# Patient Record
Sex: Male | Born: 1975 | Race: Black or African American | Hispanic: No | Marital: Married | Smoking: Current every day smoker
Health system: Southern US, Community
[De-identification: ages and names within clinical notes are randomized; demographics above are authoritative.]

## PROBLEM LIST (undated history)

## (undated) DIAGNOSIS — J45909 Unspecified asthma, uncomplicated: Secondary | ICD-10-CM

## (undated) DIAGNOSIS — D649 Anemia, unspecified: Secondary | ICD-10-CM

## (undated) DIAGNOSIS — K649 Unspecified hemorrhoids: Secondary | ICD-10-CM

## (undated) HISTORY — PX: FINGER SURGERY: SHX640

## (undated) HISTORY — PX: APPENDECTOMY: SHX54

## (undated) HISTORY — PX: CLEFT PALATE REPAIR: SUR1165

---

## 1998-06-03 ENCOUNTER — Emergency Department (HOSPITAL_COMMUNITY): Admission: EM | Admit: 1998-06-03 | Discharge: 1998-06-03 | Payer: Self-pay | Admitting: Emergency Medicine

## 1999-02-18 ENCOUNTER — Emergency Department (HOSPITAL_COMMUNITY): Admission: EM | Admit: 1999-02-18 | Discharge: 1999-02-18 | Payer: Self-pay | Admitting: Emergency Medicine

## 2001-08-21 ENCOUNTER — Emergency Department (HOSPITAL_COMMUNITY): Admission: EM | Admit: 2001-08-21 | Discharge: 2001-08-21 | Payer: Self-pay | Admitting: Emergency Medicine

## 2001-09-02 ENCOUNTER — Encounter (INDEPENDENT_AMBULATORY_CARE_PROVIDER_SITE_OTHER): Payer: Self-pay | Admitting: *Deleted

## 2001-09-02 ENCOUNTER — Ambulatory Visit (HOSPITAL_COMMUNITY): Admission: RE | Admit: 2001-09-02 | Discharge: 2001-09-03 | Payer: Self-pay | Admitting: *Deleted

## 2004-06-07 ENCOUNTER — Emergency Department (HOSPITAL_COMMUNITY): Admission: EM | Admit: 2004-06-07 | Discharge: 2004-06-07 | Payer: Self-pay | Admitting: Emergency Medicine

## 2004-08-07 ENCOUNTER — Emergency Department (HOSPITAL_COMMUNITY): Admission: EM | Admit: 2004-08-07 | Discharge: 2004-08-07 | Payer: Self-pay | Admitting: Emergency Medicine

## 2005-01-17 ENCOUNTER — Emergency Department (HOSPITAL_COMMUNITY): Admission: EM | Admit: 2005-01-17 | Discharge: 2005-01-17 | Payer: Self-pay | Admitting: Emergency Medicine

## 2007-08-15 ENCOUNTER — Emergency Department (HOSPITAL_COMMUNITY): Admission: EM | Admit: 2007-08-15 | Discharge: 2007-08-15 | Payer: Self-pay | Admitting: Emergency Medicine

## 2009-03-21 ENCOUNTER — Emergency Department (HOSPITAL_BASED_OUTPATIENT_CLINIC_OR_DEPARTMENT_OTHER): Admission: EM | Admit: 2009-03-21 | Discharge: 2009-03-21 | Payer: Self-pay | Admitting: Emergency Medicine

## 2010-06-01 LAB — URINALYSIS, ROUTINE W REFLEX MICROSCOPIC
Bilirubin Urine: NEGATIVE
Glucose, UA: NEGATIVE mg/dL
Hgb urine dipstick: NEGATIVE
Ketones, ur: NEGATIVE mg/dL
Nitrite: NEGATIVE
Protein, ur: NEGATIVE mg/dL
Specific Gravity, Urine: 1.021 (ref 1.005–1.030)
Urobilinogen, UA: 0.2 mg/dL (ref 0.0–1.0)
pH: 5 (ref 5.0–8.0)

## 2010-06-01 LAB — DIFFERENTIAL
Basophils Absolute: 0.1 10*3/uL (ref 0.0–0.1)
Basophils Relative: 2 % — ABNORMAL HIGH (ref 0–1)
Eosinophils Absolute: 0.2 10*3/uL (ref 0.0–0.7)
Eosinophils Relative: 5 % (ref 0–5)
Lymphocytes Relative: 27 % (ref 12–46)
Lymphs Abs: 1.1 10*3/uL (ref 0.7–4.0)
Monocytes Absolute: 0.4 10*3/uL (ref 0.1–1.0)
Monocytes Relative: 10 % (ref 3–12)
Neutro Abs: 2.4 10*3/uL (ref 1.7–7.7)
Neutrophils Relative %: 56 % (ref 43–77)

## 2010-06-01 LAB — CBC
HCT: 36.8 % — ABNORMAL LOW (ref 39.0–52.0)
Hemoglobin: 12.7 g/dL — ABNORMAL LOW (ref 13.0–17.0)
MCHC: 34.4 g/dL (ref 30.0–36.0)
MCV: 95.8 fL (ref 78.0–100.0)
Platelets: 221 10*3/uL (ref 150–400)
RBC: 3.84 MIL/uL — ABNORMAL LOW (ref 4.22–5.81)
RDW: 12.7 % (ref 11.5–15.5)
WBC: 4.2 10*3/uL (ref 4.0–10.5)

## 2010-06-01 LAB — BASIC METABOLIC PANEL
BUN: 11 mg/dL (ref 6–23)
CO2: 24 mEq/L (ref 19–32)
Calcium: 9 mg/dL (ref 8.4–10.5)
Chloride: 107 mEq/L (ref 96–112)
Creatinine, Ser: 1.1 mg/dL (ref 0.4–1.5)
GFR calc Af Amer: >60 mL/min (ref >60)
GFR calc non Af Amer: >60 mL/min (ref >60)
Glucose, Bld: 122 mg/dL — ABNORMAL HIGH (ref 70–99)
Potassium: 4.2 mEq/L (ref 3.5–5.1)
Sodium: 142 mEq/L (ref 135–145)

## 2010-06-01 LAB — HEMOCCULT GUIAC POC 1CARD (OFFICE): Fecal Occult Bld: POSITIVE

## 2010-08-01 NOTE — Op Note (Signed)
[place]  Patient:    Matthew Oneal, Matthew Oneal Visit Number: [id number] MRN: [id number]          Service Type: DSU Location: [phone] Attending Physician:  Kandis Mannan Dictated by:   Donnie Coffin Samuella Cota, M.D. Proc. Date: 09/02/01 Admit Date:  09/02/2001 Discharge Date: 09/03/2001                             Operative Report  CCS #[id number].  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  PROCEDURE:  Exploration of old appendectomy wound with removal of old sutures.  SURGEON:  Maisie Fus B. Samuella Cota, M.D.  ANESTHESIA:  General, anesthesiologist and C.R.N.A.  DESCRIPTION OF PROCEDURE:  The patient was taken to the operating room and placed on the table in supine position.  After satisfactory general anesthetic, the right lower quadrant of the abdomen was prepped and draped in a sterile field.  The patient had a previous appendectomy in 1994.  Since that time he has had an exploration of the wound in 1995 with removal of two Prolene sutures.  For about a year now he has had some intermittent drainage from the incision.  The patient desired to have the wound explored to see if we could get rid of the drainage.  The old transverse scar was somewhat wide and prominent.  An elliptical incision was made to remove all of the old scar. This was taken through skin and subcutaneous tissue at about the lateral one-fourth of the incision.  The patient had a small opening where he has been having some drainage.  There seemed to be some firmness going cephalad.  The subcutaneous tissue was dissected free and surrounded this one area.  There was a rather large monofilament suture with perhaps eight to 10 knots in it, which was removed.  The fascia was opened transversely and another suture was found.  Dissection was taken down deep to see if there was a tract.   The dissection did get down to the peritoneal cavity.  The patient had some small bowel adherent just beneath the scar.  Just enough of this was dissected free to be sure there was no drainage into the abdomen.  There was no evidence of any abscess in the abdomen.  The fascia and peritoneum deeply was then closed with interrupted figure-of-eight sutures of 0 Vicryl.  The wound was copiously irrigated during each layer of the closure.  A two-layered closure after that was carried out using figure-of-eight sutures of 0 Vicryl.  Another suture was found distally and another was found proximally.  At all of these sutures there was no evidence of an abscess.  I never did see any purulent drainage. The wound was copiously irrigated, subcutaneous tissue approximated loosely with 3-0 Vicryl, and skin was closed with skin staples.  A dry sterile dressing was applied.  The patient seemed to tolerate the procedure well and was taken to the PACU in satisfactory condition. Dictated by:   Donnie Coffin Samuella Cota, M.D. Attending Physician:  Kandis Mannan DD:  09/02/01 TD:  09/04/01 Job: [id number]

## 2010-12-11 LAB — URINALYSIS, ROUTINE W REFLEX MICROSCOPIC
Bilirubin Urine: NEGATIVE
Glucose, UA: NEGATIVE
Hgb urine dipstick: NEGATIVE
Ketones, ur: NEGATIVE
Nitrite: NEGATIVE
Protein, ur: NEGATIVE
Specific Gravity, Urine: 1.015
Urobilinogen, UA: 0.2
pH: 5.5

## 2010-12-11 LAB — COMPREHENSIVE METABOLIC PANEL
ALT: 16
AST: 22
Albumin: 4.2
Alkaline Phosphatase: 42
BUN: 14
CO2: 23
Calcium: 9.5
Chloride: 106
Creatinine, Ser: 1.19
GFR calc Af Amer: >60
GFR calc non Af Amer: >60
Glucose, Bld: 91
Potassium: 4
Sodium: 135
Total Bilirubin: 0.9
Total Protein: 7.2

## 2010-12-11 LAB — GIARDIA/CRYPTOSPORIDIUM SCREEN(EIA)
Cryptosporidium Screen (EIA): NEGATIVE
Giardia Screen - EIA: NEGATIVE

## 2011-09-28 ENCOUNTER — Encounter (HOSPITAL_BASED_OUTPATIENT_CLINIC_OR_DEPARTMENT_OTHER): Payer: Self-pay | Admitting: *Deleted

## 2011-09-28 ENCOUNTER — Emergency Department (HOSPITAL_BASED_OUTPATIENT_CLINIC_OR_DEPARTMENT_OTHER)
Admission: EM | Admit: 2011-09-28 | Discharge: 2011-09-28 | Disposition: A | Discharge status: Home / Self Care | Payer: Self-pay | Attending: Emergency Medicine | Admitting: Emergency Medicine

## 2011-09-28 DIAGNOSIS — T63441A Toxic effect of venom of bees, accidental (unintentional), initial encounter: Secondary | ICD-10-CM

## 2011-09-28 DIAGNOSIS — T63461A Toxic effect of venom of wasps, accidental (unintentional), initial encounter: Secondary | ICD-10-CM | POA: Insufficient documentation

## 2011-09-28 DIAGNOSIS — T6391XA Toxic effect of contact with unspecified venomous animal, accidental (unintentional), initial encounter: Secondary | ICD-10-CM | POA: Insufficient documentation

## 2011-09-28 DIAGNOSIS — F17200 Nicotine dependence, unspecified, uncomplicated: Secondary | ICD-10-CM | POA: Insufficient documentation

## 2011-09-28 HISTORY — DX: Unspecified asthma, uncomplicated: J45.909

## 2011-09-28 MED ORDER — PREDNISONE 20 MG PO TABS: 40.0000 mg | ORAL_TABLET | Freq: Every day | ORAL | Status: AC

## 2011-09-28 MED ORDER — EPINEPHRINE 0.3 MG/0.3ML IJ DEVI
0.3000 mg | INTRAMUSCULAR | Status: DC | PRN
Start: 2011-09-28 — End: 2014-11-03

## 2011-09-28 MED ORDER — PREDNISONE 50 MG PO TABS
60.0000 mg | ORAL_TABLET | Freq: Once | ORAL | Status: AC
  Administered 2011-09-28: 60 mg via ORAL
  Filled 2011-09-28: qty 1

## 2011-09-28 NOTE — Discharge Instructions (Signed)
Bee, Wasp, or Hornet Sting   Your caregiver has diagnosed you as having an insect sting. An insect sting appears as a red lump in the skin that sometimes has a tiny hole in the center, or it may have a stinger in the center of the wound. The most common stings are from wasps, hornets and bees.   Individuals have different reactions to insect stings.   A normal reaction may cause pain, swelling, and redness around the sting site.   A localized allergic reaction may cause swelling and redness that extends beyond the sting site.   A large local reaction may continue to develop over the next 12 to 36 hours.   On occasion, the reactions can be severe (anaphylactic reaction). An anaphylactic reaction may cause wheezing; difficulty breathing; chest pain; fainting; raised, itchy, red patches on the skin; a sick feeling to your stomach (nausea); vomiting; cramping; or diarrhea. If you have had an anaphylactic reaction to an insect sting in the past, you are more likely to have one again.   HOME CARE INSTRUCTIONS   With bee stings, a small sac of poison is left in the wound. Brushing across this with something such as a credit card, or anything similar, will help remove this and decrease the amount of the reaction. This same procedure will not help a wasp sting as they do not leave behind a stinger and poison sac.   Apply a cold compress for 10 to 20 minutes every hour for 1 to 2 days, depending on severity, to reduce swelling and itching.   To lessen pain, a paste made of water and baking soda may be rubbed on the bite or sting and left on for 5 minutes.   To relieve itching and swelling, you may use take medication or apply medicated creams or lotions as directed.   Only take over-the-counter or prescription medicines for pain, discomfort, or fever as directed by your caregiver.   Wash the sting site daily with soap and water. Apply antibiotic ointment on the sting site as directed.   If you suffered a severe reaction:   If  you did not require hospitalization, an adult will need to stay with you for 24 hours in case the symptoms return.   You may need to wear a medical bracelet or necklace stating the allergy.   You and your family need to learn when and how to use an anaphylaxis kit or epinephrine injection.   If you have had a severe reaction before, always carry your anaphylaxis kit with you.   SEEK MEDICAL CARE IF:   None of the above helps within 2 to 3 days.   The area becomes red, warm, tender, and swollen beyond the area of the bite or sting.   You have an oral temperature above 102° F (38.9° C).   SEEK IMMEDIATE MEDICAL CARE IF:   You have symptoms of an allergic reaction which are:   Wheezing.   Difficulty breathing.   Chest pain.   Lightheadedness or fainting.   Itchy, raised, red patches on the skin.   Nausea, vomiting, cramping or diarrhea.   ANY OF THESE SYMPTOMS MAY REPRESENT A SERIOUS PROBLEM THAT IS AN EMERGENCY. Do not wait to see if the symptoms will go away. Get medical help right away. Call your local emergency services (911 in [place] DO NOT drive yourself to the hospital.   MAKE SURE YOU:   Understand these instructions.   Will watch your condition.     Will get help right away if you are not doing well or get worse.   Document Released: 03/02/2005 Document Revised: 02/19/2011 Document Reviewed: 08/17/2009   Exit[employer]® Patient Information ©2012 [employer].

## 2011-09-28 NOTE — ED Provider Notes (Signed)
History  This chart was scribed for Gavin Pound. Oletta Lamas, MD by Bennett Scrape. This patient was seen in room MH10/MH10 and the patient's care was started at 7:34PM.  CSN: [id number]  Arrival date & time 09/28/11  1900   First MD Initiated Contact with Patient 09/28/11 1934      Chief Complaint  Patient presents with  . Allergic Reaction  . Insect Bite     The history is provided by the patient. No language interpreter was used.    Matthew Oneal is a 36 y.o. male who presents to the Emergency Department complaining of 24 hours of gradual onset, gradually worsening, constant left forearm swelling that extends into the wrist that started after being stung by a yellow jacket. He has been taking benadryl with mild improvement in the symptoms. He reports prior episodes of bee stings with similar swelling symptoms. He denies trouble swallowing, trouble breathing, chest tightness, and wheezing as associated symptoms. He has a h/o asthma. He is a current everyday smoker. Has been taking benadryl with no sig relief.   Pt is right handed.  Past Medical History  Diagnosis Date  . Asthma     Past Surgical History  Procedure Date  . Appendectomy   . Finger surgery     History reviewed. No pertinent family history.  History  Substance Use Topics  . Smoking status: Current Everyday Smoker  . Smokeless tobacco: Never Used  . Alcohol Use: Not on file      Review of Systems  Constitutional: Negative for fever and chills.  HENT: Negative for sore throat, facial swelling and trouble swallowing.   Respiratory: Negative for chest tightness, shortness of breath and wheezing.   Cardiovascular: Negative for chest pain.  Gastrointestinal: Negative for nausea and vomiting.  Musculoskeletal: Positive for joint swelling and arthralgias. Negative for back pain.       Left forearm swelling  Skin: Positive for wound (insect bite).  Neurological: Negative for dizziness, light-headedness and  headaches.    Allergies  Bee venom  Home Medications   Current Outpatient Rx  Name Route Sig Dispense Refill  . BENADRYL ALLERGY PO Oral Take 2 tablets by mouth daily as needed. Patient took this medication for his bee sting.    Marland Kitchen EPINEPHRINE 0.3 MG/0.3ML IJ DEVI Intramuscular Inject 0.3 mLs (0.3 mg total) into the muscle as needed. 1 Device 0  . PREDNISONE 20 MG PO TABS Oral Take 2 tablets (40 mg total) by mouth daily. 12 tablet 0    Triage vitals: BP 110/71  Pulse 95  Temp 98.8 F (37.1 C) (Oral)  Resp 12  SpO2 98%  Physical Exam  Nursing note and vitals reviewed. Constitutional: He is oriented to person, place, and time. He appears well-developed and well-nourished. No distress.  HENT:  Head: Normocephalic and atraumatic.  Eyes: EOM are normal.  Neck: Neck supple. No tracheal deviation present.  Cardiovascular: Normal rate.   Pulmonary/Chest: Effort normal. No stridor. No respiratory distress.  Musculoskeletal: Normal range of motion. He exhibits edema (left forearm swelling). He exhibits no tenderness.       Left forearm is diffusely swollen, not warm to touch, no rash, no induration or fluctuance noted. Decreased ROM of wrist and elbow due to pain, but able to range through entire motion.  Neurological: He is alert and oriented to person, place, and time.  Skin: Skin is warm and dry. No rash noted. There is erythema. No pallor.  Psychiatric: He has a normal mood and affect.  His behavior is normal.    ED Course  Procedures (including critical care time)  DIAGNOSTIC STUDIES: Oxygen Saturation is 98% on room air, normal by my interpretation.    COORDINATION OF CARE: 7:47PM-Discussed treatment plan which includes prednisone with pt at bedside and pt agreed to plan. Advised pt to continue the benadryl and to follow up with PCP on getting an epi pen.   Labs Reviewed - No data to display No results found.   1. Allergic reaction to bee sting       MDM  I  personally performed the services described in this documentation, which was scribed in my presence. The recorded information has been reviewed and considered.       Gavin Pound. Oletta Lamas, MD 09/28/11 0454

## 2011-09-28 NOTE — ED Notes (Signed)
Pt reports was stung on left arm by yellow jacket yeaterday- left wrist and forearm swollen- has been taking benadryl

## 2011-10-19 ENCOUNTER — Encounter (HOSPITAL_BASED_OUTPATIENT_CLINIC_OR_DEPARTMENT_OTHER): Payer: Self-pay | Admitting: *Deleted

## 2011-10-19 ENCOUNTER — Emergency Department (HOSPITAL_BASED_OUTPATIENT_CLINIC_OR_DEPARTMENT_OTHER)
Admission: EM | Admit: 2011-10-19 | Discharge: 2011-10-19 | Disposition: A | Discharge status: Home / Self Care | Payer: Self-pay | Attending: Emergency Medicine | Admitting: Emergency Medicine

## 2011-10-19 DIAGNOSIS — T6391XA Toxic effect of contact with unspecified venomous animal, accidental (unintentional), initial encounter: Secondary | ICD-10-CM | POA: Insufficient documentation

## 2011-10-19 DIAGNOSIS — F17200 Nicotine dependence, unspecified, uncomplicated: Secondary | ICD-10-CM | POA: Insufficient documentation

## 2011-10-19 DIAGNOSIS — T63461A Toxic effect of venom of wasps, accidental (unintentional), initial encounter: Secondary | ICD-10-CM | POA: Insufficient documentation

## 2011-10-19 DIAGNOSIS — Z91018 Allergy to other foods: Secondary | ICD-10-CM | POA: Insufficient documentation

## 2011-10-19 DIAGNOSIS — Z91013 Allergy to seafood: Secondary | ICD-10-CM | POA: Insufficient documentation

## 2011-10-19 MED ORDER — IBUPROFEN 800 MG PO TABS
800.0000 mg | ORAL_TABLET | Freq: Once | ORAL | Status: AC
  Administered 2011-10-19: 800 mg via ORAL
  Filled 2011-10-19 (×2): qty 1

## 2011-10-19 NOTE — ED Provider Notes (Signed)
Medical screening examination/treatment/procedure(s) were performed by non-physician practitioner and as supervising physician I was immediately available for consultation/collaboration.  Ethelda Chick, MD 10/19/11 [phone]

## 2011-10-19 NOTE — ED Notes (Signed)
Bee sting to his left arm yesterday. Here today for pain and swelling in his arm today. Has been taking Claritin and Benadryl.

## 2011-10-19 NOTE — ED Notes (Signed)
Patient states was stung by a black wasp on his arm yesterday. Patient has taken benedryl today but has had no breathing difficulty since sting.

## 2011-10-19 NOTE — ED Notes (Signed)
Patient was stung by bee yesterday early afternoon on left forearm and reports continued itching to site. Mild swelling and redness noted, no distress. Reports taking otc benadryl since the sting

## 2011-10-19 NOTE — ED Provider Notes (Signed)
History     CSN: [id number]  Arrival date & time 10/19/11  1814   First MD Initiated Contact with Patient 10/19/11 1932      Chief Complaint  Patient presents with  . Allergic Reaction    (Consider location/radiation/quality/duration/timing/severity/associated sxs/prior treatment) Patient is a 36 y.o. male presenting with allergic reaction. The history is provided by the patient and the spouse.  Allergic Reaction The primary symptoms do not include shortness of breath or rash.  Associated symptoms comments: He was stung by a wasp earlier today and presents with pain and swelling localized to the site. No SOB, swelling, or difficulty swallowing. He was told he had an allergic reaction in the past to bee stings and became concerned when the area began to swell..    Past Medical History  Diagnosis Date  . Asthma     Past Surgical History  Procedure Date  . Appendectomy   . Finger surgery     No family history on file.  History  Substance Use Topics  . Smoking status: Current Everyday Smoker  . Smokeless tobacco: Never Used  . Alcohol Use: 8.4 oz/week    14 Cans of beer per week      Review of Systems  Constitutional: Negative for fever.  HENT: Negative for facial swelling, trouble swallowing and voice change.   Respiratory: Negative for shortness of breath.   Skin: Negative for rash.       See HPI.    Allergies  Shellfish allergy and Bee venom  Home Medications   Current Outpatient Rx  Name Route Sig Dispense Refill  . DIPHENHYDRAMINE HCL 25 MG PO TABS Oral Take 75 mg by mouth once as needed. For bee sting reaction    . IBUPROFEN 200 MG PO TABS Oral Take 400 mg by mouth every 6 (six) hours as needed. For pain    . LORATADINE 10 MG PO TABS Oral Take 10 mg by mouth daily.    Marland Kitchen EPINEPHRINE 0.3 MG/0.3ML IJ DEVI Intramuscular Inject 0.3 mLs (0.3 mg total) into the muscle as needed. 1 Device 0    BP 126/70  Pulse 92  Temp 98.9 F (37.2 C) (Oral)  Resp 20   SpO2 100%  Physical Exam  Constitutional: He is oriented to person, place, and time. He appears well-developed and well-nourished. No distress.  HENT:  Mouth/Throat: Oropharynx is clear and moist.  Cardiovascular: Normal rate and regular rhythm.   Pulmonary/Chest: Effort normal and breath sounds normal. He has no wheezes.  Musculoskeletal:       Left forearm swollen proximally with central small area of redness No blistering. No induration to indicate abscess. Swelling extends outward but remains in forearm without greater degree of spread. Distal forearm has no swelling or redness.   Neurological: He is alert and oriented to person, place, and time.  Skin: No rash noted.    ED Course  Procedures (including critical care time)  Labs Reviewed - No data to display No results found.   No diagnosis found.  1. Wasp sting  MDM  Exam supports localized inflammatory response only. No evidence of systemic allergic reaction.        Rodena Medin, PA-C 10/19/11 1948

## 2012-07-06 ENCOUNTER — Emergency Department (HOSPITAL_BASED_OUTPATIENT_CLINIC_OR_DEPARTMENT_OTHER)
Admission: EM | Admit: 2012-07-06 | Discharge: 2012-07-07 | Disposition: A | Discharge status: Home / Self Care | Payer: Self-pay | Attending: Emergency Medicine | Admitting: Emergency Medicine

## 2012-07-06 ENCOUNTER — Encounter (HOSPITAL_BASED_OUTPATIENT_CLINIC_OR_DEPARTMENT_OTHER): Payer: Self-pay | Admitting: *Deleted

## 2012-07-06 DIAGNOSIS — J45909 Unspecified asthma, uncomplicated: Secondary | ICD-10-CM | POA: Insufficient documentation

## 2012-07-06 DIAGNOSIS — F17200 Nicotine dependence, unspecified, uncomplicated: Secondary | ICD-10-CM | POA: Insufficient documentation

## 2012-07-06 DIAGNOSIS — R5381 Other malaise: Secondary | ICD-10-CM | POA: Insufficient documentation

## 2012-07-06 DIAGNOSIS — R590 Localized enlarged lymph nodes: Secondary | ICD-10-CM

## 2012-07-06 DIAGNOSIS — R599 Enlarged lymph nodes, unspecified: Secondary | ICD-10-CM | POA: Insufficient documentation

## 2012-07-06 DIAGNOSIS — R51 Headache: Secondary | ICD-10-CM | POA: Insufficient documentation

## 2012-07-06 DIAGNOSIS — Z79899 Other long term (current) drug therapy: Secondary | ICD-10-CM | POA: Insufficient documentation

## 2012-07-06 LAB — CBC WITH DIFFERENTIAL/PLATELET
Basophils Absolute: 0 10*3/uL (ref 0.0–0.1)
Basophils Relative: 0 % (ref 0–1)
Eosinophils Absolute: 0.3 10*3/uL (ref 0.0–0.7)
Eosinophils Relative: 5 % (ref 0–5)
HCT: 30.8 % — ABNORMAL LOW (ref 39.0–52.0)
Hemoglobin: 10.2 g/dL — ABNORMAL LOW (ref 13.0–17.0)
Lymphocytes Relative: 34 % (ref 12–46)
Lymphs Abs: 1.9 10*3/uL (ref 0.7–4.0)
MCH: 28.8 pg (ref 26.0–34.0)
MCHC: 33.1 g/dL (ref 30.0–36.0)
MCV: 87 fL (ref 78.0–100.0)
Monocytes Absolute: 0.7 10*3/uL (ref 0.1–1.0)
Monocytes Relative: 12 % (ref 3–12)
Neutro Abs: 2.7 10*3/uL (ref 1.7–7.7)
Neutrophils Relative %: 48 % (ref 43–77)
Platelets: 256 10*3/uL (ref 150–400)
RBC: 3.54 MIL/uL — ABNORMAL LOW (ref 4.22–5.81)
RDW: 13.6 % (ref 11.5–15.5)
WBC: 5.6 10*3/uL (ref 4.0–10.5)

## 2012-07-06 NOTE — ED Provider Notes (Signed)
History     CSN: [id number]  Arrival date & time 07/06/12  1925   First MD Initiated Contact with Patient 07/06/12 2332      Chief Complaint  Patient presents with  . Facial Swelling    (Consider location/radiation/quality/duration/timing/severity/associated sxs/prior treatment) HPI This is a 37 year old male with 2-3 day history of swollen, tender anterior cervical lymph nodes. The symptoms are mild to moderate. There's been occasional mild headache associated with it along with some general malaise he denies fever or sore throat. He denies abdominal pain.  Past Medical History  Diagnosis Date  . Asthma     Past Surgical History  Procedure Laterality Date  . Appendectomy    . Finger surgery      History reviewed. No pertinent family history.  History  Substance Use Topics  . Smoking status: Current Every Day Smoker    Types: Cigarettes  . Smokeless tobacco: Never Used  . Alcohol Use: 8.4 oz/week    14 Cans of beer per week      Review of Systems  All other systems reviewed and are negative.    Allergies  Shellfish allergy and Bee venom  Home Medications   Current Outpatient Rx  Name  Route  Sig  Dispense  Refill  . diphenhydrAMINE (BENADRYL) 25 MG tablet   Oral   Take 75 mg by mouth once as needed. For bee sting reaction         . EPINEPHrine (EPIPEN) 0.3 mg/0.3 mL DEVI   Intramuscular   Inject 0.3 mLs (0.3 mg total) into the muscle as needed.   1 Device   0   . ibuprofen (ADVIL,MOTRIN) 200 MG tablet   Oral   Take 400 mg by mouth every 6 (six) hours as needed. For pain         . loratadine (CLARITIN) 10 MG tablet   Oral   Take 10 mg by mouth daily.           BP 136/74  Pulse 99  Temp(Src) 98.1 F (36.7 C) (Oral)  Resp 16  Ht 5\' 10"  (1.778 m)  Wt 183 lb (83.008 kg)  BMI 26.26 kg/m2  SpO2 96%  Physical Exam General: Well-developed, well-nourished male in no acute distress; appearance consistent with age of record HENT:  normocephalic, atraumatic; surgical changes consistent with cleft palate repair; no pharyngeal erythema or exudate Eyes: pupils equal round and reactive to light; extraocular muscles intact Neck: supple; mild anterior cervical lymphadenopathy Heart: regular rate and rhythm Lungs: clear to auscultation bilaterally Abdomen: soft; nondistended; nontender; no masses or hepatosplenomegaly; bowel sounds present Extremities: No deformity; full range of motion Neurologic: Awake, alert and oriented; motor function intact in all extremities and symmetric; no facial droop Skin: Warm and dry Psychiatric: Normal mood and affect    ED Course  Procedures (including critical care time)     MDM   Nursing notes and vitals signs, including pulse oximetry, reviewed.  Summary of this visit's results, reviewed by myself:  Labs:  Results for orders placed during the hospital encounter of 07/06/12 (from the past 24 hour(s))  CBC WITH DIFFERENTIAL     Status: Abnormal   Collection Time    07/06/12 11:37 PM      Result Value Range   WBC 5.6  4.0 - 10.5 K/uL   RBC 3.54 (*) 4.22 - 5.81 MIL/uL   Hemoglobin 10.2 (*) 13.0 - 17.0 g/dL   HCT 40.9 (*) 81.1 - 91.4 %   MCV 87.0  78.0 - 100.0 fL   MCH 28.8  26.0 - 34.0 pg   MCHC 33.1  30.0 - 36.0 g/dL   RDW 16.1  09.6 - 04.5 %   Platelets 256  150 - 400 K/uL   Neutrophils Relative 48  43 - 77 %   Neutro Abs 2.7  1.7 - 7.7 K/uL   Lymphocytes Relative 34  12 - 46 %   Lymphs Abs 1.9  0.7 - 4.0 K/uL   Monocytes Relative 12  3 - 12 %   Monocytes Absolute 0.7  0.1 - 1.0 K/uL   Eosinophils Relative 5  0 - 5 %   Eosinophils Absolute 0.3  0.0 - 0.7 K/uL   Basophils Relative 0  0 - 1 %   Basophils Absolute 0.0  0.0 - 0.1 K/uL  MONONUCLEOSIS SCREEN     Status: None   Collection Time    07/06/12 11:37 PM      Result Value Range   Mono Screen NEGATIVE  NEGATIVE           Carlisle Beers Skye Plamondon, MD 07/07/12 [phone]

## 2012-07-06 NOTE — ED Notes (Signed)
Pt c/o " swelling lymph neck glands" denies sore throat

## 2012-07-06 NOTE — ED Notes (Signed)
Pt c/o swollen lymph notes.  States has had sinus congestion and mild cough for past 1-2 weeks, and noticed the swelling 3-4 days ago.

## 2012-07-07 ENCOUNTER — Encounter (HOSPITAL_BASED_OUTPATIENT_CLINIC_OR_DEPARTMENT_OTHER): Payer: Self-pay | Admitting: Emergency Medicine

## 2012-07-07 LAB — MONONUCLEOSIS SCREEN: Mono Screen: NEGATIVE

## 2013-01-09 ENCOUNTER — Encounter (HOSPITAL_BASED_OUTPATIENT_CLINIC_OR_DEPARTMENT_OTHER): Payer: Self-pay | Admitting: Emergency Medicine

## 2013-01-09 ENCOUNTER — Emergency Department (HOSPITAL_BASED_OUTPATIENT_CLINIC_OR_DEPARTMENT_OTHER): Payer: Self-pay

## 2013-01-09 ENCOUNTER — Emergency Department (HOSPITAL_BASED_OUTPATIENT_CLINIC_OR_DEPARTMENT_OTHER)
Admission: EM | Admit: 2013-01-09 | Discharge: 2013-01-09 | Disposition: A | Discharge status: Home / Self Care | Payer: Self-pay | Attending: Emergency Medicine | Admitting: Emergency Medicine

## 2013-01-09 DIAGNOSIS — J45909 Unspecified asthma, uncomplicated: Secondary | ICD-10-CM | POA: Insufficient documentation

## 2013-01-09 DIAGNOSIS — Z79899 Other long term (current) drug therapy: Secondary | ICD-10-CM | POA: Insufficient documentation

## 2013-01-09 DIAGNOSIS — K8689 Other specified diseases of pancreas: Secondary | ICD-10-CM | POA: Insufficient documentation

## 2013-01-09 DIAGNOSIS — F17200 Nicotine dependence, unspecified, uncomplicated: Secondary | ICD-10-CM | POA: Insufficient documentation

## 2013-01-09 LAB — COMPREHENSIVE METABOLIC PANEL
Albumin: 3.8 g/dL (ref 3.5–5.2)
Alkaline Phosphatase: 53 U/L (ref 39–117)
BUN: 11 mg/dL (ref 6–23)
CO2: 24 mEq/L (ref 19–32)
Calcium: 9.5 mg/dL (ref 8.4–10.5)
Creatinine, Ser: 1.3 mg/dL (ref 0.50–1.35)
GFR calc Af Amer: 79 mL/min — ABNORMAL LOW (ref >90)
GFR calc non Af Amer: 68 mL/min — ABNORMAL LOW (ref >90)
Glucose, Bld: 99 mg/dL (ref 70–99)
Potassium: 4.3 mEq/L (ref 3.5–5.1)
Sodium: 139 mEq/L (ref 135–145)
Total Bilirubin: 0.4 mg/dL (ref 0.3–1.2)
Total Protein: 7.6 g/dL (ref 6.0–8.3)

## 2013-01-09 LAB — URINALYSIS, ROUTINE W REFLEX MICROSCOPIC
Bilirubin Urine: NEGATIVE
Hgb urine dipstick: NEGATIVE
Ketones, ur: NEGATIVE mg/dL
Nitrite: NEGATIVE
Urobilinogen, UA: 0.2 mg/dL (ref 0.0–1.0)
pH: 5 (ref 5.0–8.0)

## 2013-01-09 LAB — CBC WITH DIFFERENTIAL/PLATELET
Basophils Absolute: 0 10*3/uL (ref 0.0–0.1)
Basophils Relative: 0 % (ref 0–1)
Eosinophils Absolute: 0.2 10*3/uL (ref 0.0–0.7)
Eosinophils Relative: 4 % (ref 0–5)
HCT: 27.7 % — ABNORMAL LOW (ref 39.0–52.0)
Hemoglobin: 8.5 g/dL — ABNORMAL LOW (ref 13.0–17.0)
MCH: 23 pg — ABNORMAL LOW (ref 26.0–34.0)
MCHC: 30.7 g/dL (ref 30.0–36.0)
MCV: 75.1 fL — ABNORMAL LOW (ref 78.0–100.0)
Monocytes Absolute: 0.7 10*3/uL (ref 0.1–1.0)
Monocytes Relative: 14 % — ABNORMAL HIGH (ref 3–12)
Neutrophils Relative %: 53 % (ref 43–77)
Platelets: 303 10*3/uL (ref 150–400)
RBC: 3.69 MIL/uL — ABNORMAL LOW (ref 4.22–5.81)

## 2013-01-09 LAB — LIPASE, BLOOD: Lipase: 12 U/L (ref 11–59)

## 2013-01-09 MED ORDER — ONDANSETRON HCL 4 MG/2ML IJ SOLN
4.0000 mg | Freq: Once | INTRAMUSCULAR | Status: AC
  Administered 2013-01-09: 4 mg via INTRAMUSCULAR
  Filled 2013-01-09: qty 2

## 2013-01-09 MED ORDER — IOHEXOL 300 MG/ML  SOLN
50.0000 mL | Freq: Once | INTRAMUSCULAR | Status: AC | PRN
  Administered 2013-01-09: 50 mL via ORAL

## 2013-01-09 MED ORDER — HYDROCODONE-ACETAMINOPHEN 5-325 MG PO TABS: 2.0000 | ORAL_TABLET | ORAL | Status: DC | PRN

## 2013-01-09 MED ORDER — FAMOTIDINE IN NACL 20-0.9 MG/50ML-% IV SOLN
20.0000 mg | Freq: Once | INTRAVENOUS | Status: AC
  Administered 2013-01-09: 20 mg via INTRAVENOUS
  Filled 2013-01-09: qty 50

## 2013-01-09 MED ORDER — IOHEXOL 300 MG/ML  SOLN
100.0000 mL | Freq: Once | INTRAMUSCULAR | Status: AC | PRN
  Administered 2013-01-09: 100 mL via INTRAVENOUS

## 2013-01-09 MED ORDER — LANSOPRAZOLE 30 MG PO CPDR: 30.0000 mg | DELAYED_RELEASE_CAPSULE | Freq: Every day | ORAL | Status: DC

## 2013-01-09 NOTE — ED Provider Notes (Signed)
CSN: [id number]     Arrival date & time 01/09/13  1123 History   First MD Initiated Contact with Patient 01/09/13 1313     Chief Complaint  Patient presents with  . Abdominal Pain   (Consider location/radiation/quality/duration/timing/severity/associated sxs/prior Treatment) Patient is a 37 y.o. male presenting with abdominal pain. The history is provided by the patient. No language interpreter was used.  Abdominal Pain Pain location:  Epigastric Pain quality: aching and fullness   Pain radiates to:  Does not radiate Pain severity:  No pain Timing:  Constant Progression:  Worsening Chronicity:  New Worsened by:  Nothing tried Ineffective treatments:  None tried Associated symptoms: no vomiting     Past Medical History  Diagnosis Date  . Asthma    Past Surgical History  Procedure Laterality Date  . Appendectomy    . Finger surgery    . Cleft palate repair     No family history on file. History  Substance Use Topics  . Smoking status: Current Every Day Smoker    Types: Cigarettes  . Smokeless tobacco: Never Used  . Alcohol Use: 8.4 oz/week    14 Cans of beer per week     Comment: occassionally    Review of Systems  Gastrointestinal: Positive for abdominal pain. Negative for vomiting.  All other systems reviewed and are negative.    Allergies  Shellfish allergy and Bee venom  Home Medications   Current Outpatient Rx  Name  Route  Sig  Dispense  Refill  . diphenhydrAMINE (BENADRYL) 25 MG tablet   Oral   Take 75 mg by mouth once as needed. For bee sting reaction         . EPINEPHrine (EPIPEN) 0.3 mg/0.3 mL DEVI   Intramuscular   Inject 0.3 mLs (0.3 mg total) into the muscle as needed.   1 Device   0   . ibuprofen (ADVIL,MOTRIN) 200 MG tablet   Oral   Take 400 mg by mouth every 6 (six) hours as needed. For pain         . loratadine (CLARITIN) 10 MG tablet   Oral   Take 10 mg by mouth daily.          BP 122/80  Pulse 71  Temp(Src) 97.8 F  (36.6 C) (Oral)  Resp 16  Ht 5\' 10"  (1.778 m)  Wt 182 lb (82.555 kg)  BMI 26.11 kg/m2  SpO2 100% Physical Exam  Nursing note and vitals reviewed. Constitutional: He is oriented to person, place, and time. He appears well-developed and well-nourished.  HENT:  Head: Normocephalic and atraumatic.  Eyes: Conjunctivae and EOM are normal. Pupils are equal, round, and reactive to light.  Neck: Normal range of motion.  Cardiovascular: Normal rate.   Pulmonary/Chest: Effort normal and breath sounds normal.  Abdominal: Soft. Bowel sounds are normal. There is tenderness.  Musculoskeletal: Normal range of motion.  Neurological: He is alert and oriented to person, place, and time. He has normal reflexes.  Skin: Skin is warm.  Psychiatric: He has a normal mood and affect.    ED Course  Procedures (including critical care time) Labs Review Labs Reviewed  CBC WITH DIFFERENTIAL - Abnormal; Notable for the following:    RBC 3.69 (*)    Hemoglobin 8.5 (*)    HCT 27.7 (*)    MCV 75.1 (*)    MCH 23.0 (*)    RDW 18.0 (*)    Monocytes Relative 14 (*)    All other components within  normal limits  COMPREHENSIVE METABOLIC PANEL - Abnormal; Notable for the following:    GFR calc non Af Amer 68 (*)    GFR calc Af Amer 79 (*)    All other components within normal limits  URINALYSIS, ROUTINE W REFLEX MICROSCOPIC  LIPASE, BLOOD   Imaging Review Ct Abdomen Pelvis W Contrast  01/09/2013   CLINICAL DATA:  The midline abdominal pain  EXAM: CT ABDOMEN AND PELVIS WITH CONTRAST  TECHNIQUE: Multidetector CT imaging of the abdomen and pelvis was performed using the standard protocol following bolus administration of intravenous contrast. Oral contrast was also administered.  CONTRAST:  OMNIPAQUE IOHEXOL 300 MG/ML  SOLN  COMPARISON:  None.  FINDINGS: The lung bases are clear.  There is a 1 cm apparent cyst in the dome of the liver toward the right. No other focal liver lesion identified. There is no  appreciable biliary duct dilatation.  Spleen appears normal.  There is soft tissue fullness in the head of the pancreas measuring 4.6 x 3.7 cm. The remainder the pancreas appears normal. There is no appreciable pancreatic duct dilatation. This fullness is of the same attenuation as the remainder the pancreas. A small amount of fluid surrounds this soft tissue fullness in the head of pancreas region.  The adrenals and kidneys appear within normal limits.  In the pelvis, there is no mass or fluid collection. Appendix is absent.  There is no bowel obstruction. No free air or portal venous air.  There is no ascites, adenopathy, or abscess in the abdomen or pelvis. Aorta is nonaneurysmal. There are no blastic or lytic bone lesions.  IMPRESSION: There is soft tissue prominence in the head of the pancreas the which is of the same attenuation as the remainder the pancreas. There is a small amount of surrounding fluid. There is no evidence of abscess or pseudocyst. There is no biliary or pancreatic duct dilatation. Suspect a degree of acute pancreatitis. As a pancreatic mass could present in this manner, nonemergent MR of the pancreas with and without contrast advised to further assess.  Small liver cyst.  No bowel obstruction. No abscess. Appendix absent.   Electronically Signed   By: Bretta Bang M.D.   On: 01/09/2013 14:38    EKG Interpretation   None       MDM   1. Mass of pancreas    Pt scheduled for MRi tomorrow to evaluate mass    Elson Areas, PA-C 01/09/13 1511  Lonia Skinner [place], [place] 01/09/13 1511

## 2013-01-09 NOTE — ED Notes (Signed)
Patient states he has a two week history of mid abdominal pain which has been associated with a small amount of vomiting and diarrhea.

## 2013-01-10 ENCOUNTER — Encounter (HOSPITAL_BASED_OUTPATIENT_CLINIC_OR_DEPARTMENT_OTHER): Payer: Self-pay | Admitting: Physician Assistant

## 2013-01-10 ENCOUNTER — Ambulatory Visit (HOSPITAL_BASED_OUTPATIENT_CLINIC_OR_DEPARTMENT_OTHER)
Admission: RE | Admit: 2013-01-10 | Discharge: 2013-01-10 | Disposition: A | Discharge status: Home / Self Care | Payer: Self-pay | Source: Ambulatory Visit | Attending: Physician Assistant | Admitting: Physician Assistant

## 2013-01-10 ENCOUNTER — Ambulatory Visit (HOSPITAL_BASED_OUTPATIENT_CLINIC_OR_DEPARTMENT_OTHER): Admit: 2013-01-10 | Payer: Self-pay

## 2013-01-10 ENCOUNTER — Other Ambulatory Visit (HOSPITAL_BASED_OUTPATIENT_CLINIC_OR_DEPARTMENT_OTHER): Payer: Self-pay | Admitting: Physician Assistant

## 2013-01-10 DIAGNOSIS — R197 Diarrhea, unspecified: Secondary | ICD-10-CM | POA: Insufficient documentation

## 2013-01-10 DIAGNOSIS — R109 Unspecified abdominal pain: Secondary | ICD-10-CM | POA: Insufficient documentation

## 2013-01-10 DIAGNOSIS — K869 Disease of pancreas, unspecified: Secondary | ICD-10-CM | POA: Insufficient documentation

## 2013-01-10 DIAGNOSIS — K7689 Other specified diseases of liver: Secondary | ICD-10-CM | POA: Insufficient documentation

## 2013-01-10 MED ORDER — GADOBENATE DIMEGLUMINE 529 MG/ML IV SOLN
17.0000 mL | Freq: Once | INTRAVENOUS | Status: AC | PRN
  Administered 2013-01-10: 17 mL via INTRAVENOUS

## 2013-01-11 NOTE — ED Provider Notes (Signed)
History/physical exam/procedure(s) were performed by non-physician practitioner and as supervising physician I was immediately available for consultation/collaboration. I have reviewed all notes and am in agreement with care and plan.   Hilario Quarry, MD 01/11/13 [phone]

## 2014-09-04 ENCOUNTER — Emergency Department (HOSPITAL_BASED_OUTPATIENT_CLINIC_OR_DEPARTMENT_OTHER): Payer: Self-pay

## 2014-09-04 ENCOUNTER — Encounter (HOSPITAL_BASED_OUTPATIENT_CLINIC_OR_DEPARTMENT_OTHER): Payer: Self-pay | Admitting: Emergency Medicine

## 2014-09-04 ENCOUNTER — Emergency Department (HOSPITAL_BASED_OUTPATIENT_CLINIC_OR_DEPARTMENT_OTHER)
Admission: EM | Admit: 2014-09-04 | Discharge: 2014-09-04 | Disposition: A | Discharge status: Home / Self Care | Payer: Self-pay | Attending: Emergency Medicine | Admitting: Emergency Medicine

## 2014-09-04 DIAGNOSIS — Z79899 Other long term (current) drug therapy: Secondary | ICD-10-CM | POA: Insufficient documentation

## 2014-09-04 DIAGNOSIS — Z720 Tobacco use: Secondary | ICD-10-CM | POA: Insufficient documentation

## 2014-09-04 DIAGNOSIS — K5900 Constipation, unspecified: Secondary | ICD-10-CM | POA: Insufficient documentation

## 2014-09-04 DIAGNOSIS — J45909 Unspecified asthma, uncomplicated: Secondary | ICD-10-CM | POA: Insufficient documentation

## 2014-09-04 DIAGNOSIS — K2990 Gastroduodenitis, unspecified, without bleeding: Secondary | ICD-10-CM | POA: Insufficient documentation

## 2014-09-04 MED ORDER — DICYCLOMINE HCL 10 MG/ML IM SOLN
20.0000 mg | Freq: Once | INTRAMUSCULAR | Status: AC
  Administered 2014-09-04: 20 mg via INTRAMUSCULAR
  Filled 2014-09-04: qty 2

## 2014-09-04 MED ORDER — OMEPRAZOLE 20 MG PO CPDR: 20.0000 mg | DELAYED_RELEASE_CAPSULE | Freq: Every day | ORAL | Status: DC

## 2014-09-04 MED ORDER — GI COCKTAIL ~~LOC~~
30.0000 mL | Freq: Once | ORAL | Status: AC
  Administered 2014-09-04: 30 mL via ORAL
  Filled 2014-09-04: qty 30

## 2014-09-04 NOTE — ED Notes (Signed)
abd p[ain n/v/diarrhea   States bloody and black stool  Feels like bubble in epigastric area

## 2014-09-04 NOTE — ED Notes (Signed)
C/o abd pain x 2 weeks  w n/v

## 2014-09-04 NOTE — ED Provider Notes (Signed)
CSN: [id number]     Arrival date & time 09/04/14  0257 History   First MD Initiated Contact with Patient 09/04/14 0310     Chief Complaint  Patient presents with  . Abdominal Pain     (Consider location/radiation/quality/duration/timing/severity/associated sxs/prior Treatment) Patient is a 39 y.o. male presenting with abdominal pain. The history is provided by the patient.  Abdominal Pain Pain location:  Epigastric Pain quality: bloating and cramping   Pain radiates to:  Does not radiate Pain severity:  Moderate Onset quality:  Gradual Duration:  2 weeks Timing:  Constant Progression:  Unchanged Chronicity:  Recurrent Context: alcohol use   Relieved by:  Nothing Worsened by:  Nothing tried Ineffective treatments: alcohol. Associated symptoms: constipation and diarrhea   Associated symptoms: no chest pain, no fever and no shortness of breath   Risk factors: alcohol abuse   Risk factors: no recent hospitalization     Past Medical History  Diagnosis Date  . Asthma    Past Surgical History  Procedure Laterality Date  . Appendectomy    . Finger surgery    . Cleft palate repair     History reviewed. No pertinent family history. History  Substance Use Topics  . Smoking status: Current Every Day Smoker    Types: Cigarettes  . Smokeless tobacco: Never Used  . Alcohol Use: 8.4 oz/week    14 Cans of beer per week     Comment: occassionally    Review of Systems  Constitutional: Negative for fever.  Respiratory: Negative for shortness of breath.   Cardiovascular: Negative for chest pain.  Gastrointestinal: Positive for abdominal pain, diarrhea and constipation.  All other systems reviewed and are negative.     Allergies  Shellfish allergy and Bee venom  Home Medications   Prior to Admission medications   Medication Sig Start Date End Date Taking? Authorizing Provider  diphenhydrAMINE (BENADRYL) 25 MG tablet Take 75 mg by mouth once as needed. For bee sting  reaction    Historical Provider, MD  EPINEPHrine (EPIPEN) 0.3 mg/0.3 mL DEVI Inject 0.3 mLs (0.3 mg total) into the muscle as needed. 09/28/11   Quita Skye, MD  HYDROcodone-acetaminophen (NORCO/VICODIN) 5-325 MG per tablet Take 2 tablets by mouth every 4 (four) hours as needed for pain. 01/09/13   Elson Areas, PA-C  ibuprofen (ADVIL,MOTRIN) 200 MG tablet Take 400 mg by mouth every 6 (six) hours as needed. For pain    Historical Provider, MD  lansoprazole (PREVACID) 30 MG capsule Take 1 capsule (30 mg total) by mouth daily. 01/09/13   Elson Areas, PA-C  loratadine (CLARITIN) 10 MG tablet Take 10 mg by mouth daily.    Historical Provider, MD   BP 132/75 mmHg  Pulse 80  Resp 18  Ht 5' 10.5" (1.791 m)  Wt 175 lb (79.379 kg)  BMI 24.75 kg/m2  SpO2 100% Physical Exam  Constitutional: He is oriented to person, place, and time. He appears well-developed and well-nourished. No distress.  HENT:  Head: Normocephalic and atraumatic.  Mouth/Throat: Oropharynx is clear and moist.  Eyes: Conjunctivae and EOM are normal. Pupils are equal, round, and reactive to light.  Neck: Normal range of motion. Neck supple.  Cardiovascular: Normal rate and regular rhythm.   Pulmonary/Chest: Effort normal and breath sounds normal. No respiratory distress. He has no wheezes. He has no rales.  Abdominal: Soft. He exhibits no fluid wave, no ascites and no mass. Bowel sounds are increased. There is no tenderness. There is no rigidity,  no rebound, no guarding, no tenderness at McBurney's point and negative Murphy's sign.  Musculoskeletal: Normal range of motion.  Lymphadenopathy:    He has no cervical adenopathy.  Neurological: He is alert and oriented to person, place, and time.  Skin: Skin is warm and dry.  Psychiatric: He has a normal mood and affect.    ED Course  Procedures (including critical care time) Labs Review Labs Reviewed - No data to display  Imaging Review No results found.   EKG  Interpretation None      MDM   Final diagnoses:  None    Well appearing, exam and vitals are benign and reassuring. This is clearly alcohol gastritis as patient consumes > or equal to 96 oz of beer daily. There are no indications for labs or advanced imaging at this time.  Stop alcohol.  Eat a healthy diet.  Will start a PPI daily.  Strict abdominal pain return precautions given   Chloey Ricard, MD 09/04/14 0272

## 2014-09-04 NOTE — Discharge Instructions (Signed)
Gastritis, Adult Gastritis is soreness and swelling (inflammation) of the lining of the stomach. Gastritis can develop as a sudden onset (acute) or long-term (chronic) condition. If gastritis is not treated, it can lead to stomach bleeding and ulcers. CAUSES  Gastritis occurs when the stomach lining is weak or damaged. Digestive juices from the stomach then inflame the weakened stomach lining. The stomach lining may be weak or damaged due to viral or bacterial infections. One common bacterial infection is the Helicobacter pylori infection. Gastritis can also result from excessive alcohol consumption, taking certain medicines, or having too much acid in the stomach.  SYMPTOMS  In some cases, there are no symptoms. When symptoms are present, they may include:  Pain or a burning sensation in the upper abdomen.  Nausea.  Vomiting.  An uncomfortable feeling of fullness after eating. DIAGNOSIS  Your caregiver may suspect you have gastritis based on your symptoms and a physical exam. To determine the cause of your gastritis, your caregiver may perform the following:  Blood or stool tests to check for the H pylori bacterium.  Gastroscopy. A thin, flexible tube (endoscope) is passed down the esophagus and into the stomach. The endoscope has a light and camera on the end. Your caregiver uses the endoscope to view the inside of the stomach.  Taking a tissue sample (biopsy) from the stomach to examine under a microscope. TREATMENT  Depending on the cause of your gastritis, medicines may be prescribed. If you have a bacterial infection, such as an H pylori infection, antibiotics may be given. If your gastritis is caused by too much acid in the stomach, H2 blockers or antacids may be given. Your caregiver may recommend that you stop taking aspirin, ibuprofen, or other nonsteroidal anti-inflammatory drugs (NSAIDs). HOME CARE INSTRUCTIONS  Only take over-the-counter or prescription medicines as directed by  your caregiver.  If you were given antibiotic medicines, take them as directed. Finish them even if you start to feel better.  Drink enough fluids to keep your urine clear or pale yellow.  Avoid foods and drinks that make your symptoms worse, such as:  Caffeine or alcoholic drinks.  Chocolate.  Peppermint or mint flavorings.  Garlic and onions.  Spicy foods.  Citrus fruits, such as oranges, lemons, or limes.  Tomato-based foods such as sauce, chili, salsa, and pizza.  Fried and fatty foods.  Eat small, frequent meals instead of large meals. SEEK IMMEDIATE MEDICAL CARE IF:   You have black or dark red stools.  You vomit blood or material that looks like coffee grounds.  You are unable to keep fluids down.  Your abdominal pain gets worse.  You have a fever.  You do not feel better after 1 week.  You have any other questions or concerns. MAKE SURE YOU:  Understand these instructions.  Will watch your condition.  Will get help right away if you are not doing well or get worse. Document Released: 02/24/2001 Document Revised: 09/01/2011 Document Reviewed: 04/15/2011 [facility] Patient Information 2015 [place], [place]. This information is not intended to replace advice given to you by your health care provider. Make sure you discuss any questions you have with your health care provider.  NO ALCOHOL!

## 2014-11-03 ENCOUNTER — Encounter (HOSPITAL_BASED_OUTPATIENT_CLINIC_OR_DEPARTMENT_OTHER): Payer: Self-pay | Admitting: Emergency Medicine

## 2014-11-03 ENCOUNTER — Emergency Department (HOSPITAL_BASED_OUTPATIENT_CLINIC_OR_DEPARTMENT_OTHER)
Admission: EM | Admit: 2014-11-03 | Discharge: 2014-11-03 | Disposition: A | Discharge status: Home / Self Care | Payer: Self-pay | Attending: Emergency Medicine | Admitting: Emergency Medicine

## 2014-11-03 DIAGNOSIS — D509 Iron deficiency anemia, unspecified: Secondary | ICD-10-CM | POA: Insufficient documentation

## 2014-11-03 DIAGNOSIS — A084 Viral intestinal infection, unspecified: Secondary | ICD-10-CM | POA: Insufficient documentation

## 2014-11-03 DIAGNOSIS — J45909 Unspecified asthma, uncomplicated: Secondary | ICD-10-CM | POA: Insufficient documentation

## 2014-11-03 DIAGNOSIS — Z79899 Other long term (current) drug therapy: Secondary | ICD-10-CM | POA: Insufficient documentation

## 2014-11-03 DIAGNOSIS — Z720 Tobacco use: Secondary | ICD-10-CM | POA: Insufficient documentation

## 2014-11-03 DIAGNOSIS — Z9049 Acquired absence of other specified parts of digestive tract: Secondary | ICD-10-CM | POA: Insufficient documentation

## 2014-11-03 LAB — COMPREHENSIVE METABOLIC PANEL
ALK PHOS: 38 U/L (ref 38–126)
ALT: 12 U/L — AB (ref 17–63)
AST: 26 U/L (ref 15–41)
Albumin: 4.4 g/dL (ref 3.5–5.0)
Anion gap: 11 (ref 5–15)
BUN: 20 mg/dL (ref 6–20)
CO2: 24 mmol/L (ref 22–32)
CREATININE: 1.28 mg/dL — AB (ref 0.61–1.24)
Calcium: 9.9 mg/dL (ref 8.9–10.3)
Chloride: 109 mmol/L (ref 101–111)
GFR calc non Af Amer: >60 mL/min (ref >60)
GLUCOSE: 105 mg/dL — AB (ref 65–99)
Potassium: 3.9 mmol/L (ref 3.5–5.1)
SODIUM: 144 mmol/L (ref 135–145)
Total Bilirubin: 0.3 mg/dL (ref 0.3–1.2)
Total Protein: 8.7 g/dL — ABNORMAL HIGH (ref 6.5–8.1)

## 2014-11-03 LAB — CBC WITH DIFFERENTIAL/PLATELET
Basophils Absolute: 0 10*3/uL (ref 0.0–0.1)
Basophils Relative: 0 % (ref 0–1)
EOS ABS: 0.1 10*3/uL (ref 0.0–0.7)
Eosinophils Relative: 1 % (ref 0–5)
HCT: 26.7 % — ABNORMAL LOW (ref 39.0–52.0)
Hemoglobin: 7.3 g/dL — ABNORMAL LOW (ref 13.0–17.0)
LYMPHS ABS: 0.7 10*3/uL (ref 0.7–4.0)
Lymphocytes Relative: 6 % — ABNORMAL LOW (ref 12–46)
MCH: 17.3 pg — ABNORMAL LOW (ref 26.0–34.0)
MCHC: 27.3 g/dL — ABNORMAL LOW (ref 30.0–36.0)
MCV: 63.3 fL — ABNORMAL LOW (ref 78.0–100.0)
Monocytes Absolute: 0.6 10*3/uL (ref 0.1–1.0)
Monocytes Relative: 5 % (ref 3–12)
Neutro Abs: 10.7 10*3/uL — ABNORMAL HIGH (ref 1.7–7.7)
Neutrophils Relative %: 88 % — ABNORMAL HIGH (ref 43–77)
Platelets: 453 10*3/uL — ABNORMAL HIGH (ref 150–400)
RBC: 4.22 MIL/uL (ref 4.22–5.81)
RDW: 21 % — ABNORMAL HIGH (ref 11.5–15.5)
WBC: 12.1 10*3/uL — AB (ref 4.0–10.5)

## 2014-11-03 LAB — LIPASE, BLOOD: Lipase: 22 U/L (ref 22–51)

## 2014-11-03 MED ORDER — FERROUS SULFATE 325 (65 FE) MG PO TABS: 325.0000 mg | ORAL_TABLET | Freq: Three times a day (TID) | ORAL | Status: DC

## 2014-11-03 MED ORDER — ONDANSETRON HCL 4 MG/2ML IJ SOLN
4.0000 mg | Freq: Once | INTRAMUSCULAR | Status: AC
  Administered 2014-11-03: 4 mg via INTRAVENOUS
  Filled 2014-11-03: qty 2

## 2014-11-03 MED ORDER — SODIUM CHLORIDE 0.9 % IV SOLN
Freq: Once | INTRAVENOUS | Status: AC
  Administered 2014-11-03: 04:00:00 via INTRAVENOUS

## 2014-11-03 MED ORDER — FENTANYL CITRATE (PF) 100 MCG/2ML IJ SOLN
100.0000 ug | Freq: Once | INTRAMUSCULAR | Status: AC
  Administered 2014-11-03: 100 ug via INTRAVENOUS
  Filled 2014-11-03: qty 2

## 2014-11-03 MED ORDER — ONDANSETRON 8 MG PO TBDP: 8.0000 mg | ORAL_TABLET | Freq: Three times a day (TID) | ORAL | Status: DC | PRN

## 2014-11-03 NOTE — ED Notes (Signed)
Per patient he awoke with severe abdominal pain and diarrhea

## 2014-11-03 NOTE — ED Provider Notes (Signed)
CSN: [id number]     Arrival date & time 11/03/14  0243 History   First MD Initiated Contact with Patient 11/03/14 [phone]     Chief Complaint  Patient presents with  . Abdominal Pain     (Consider location/radiation/quality/duration/timing/severity/associated sxs/prior Treatment) HPI  This is a 39 year old male who awoke about midnight with nausea, vomiting and diarrhea. This is accompanied by generalized abdominal cramping more prominent in the mid abdomen. He rates pain as severe. His nausea has has improved. He has been having chills but no fever. He prefers to lie on his right side in a fetal position and is difficult to engage in conversation.  Past Medical History  Diagnosis Date  . Asthma    Past Surgical History  Procedure Laterality Date  . Appendectomy    . Finger surgery    . Cleft palate repair     History reviewed. No pertinent family history. Social History  Substance Use Topics  . Smoking status: Current Every Day Smoker    Types: Cigarettes  . Smokeless tobacco: Never Used  . Alcohol Use: Yes     Comment: daily    Review of Systems  All other systems reviewed and are negative.   Allergies  Shellfish allergy and Bee venom  Home Medications   Prior to Admission medications   Medication Sig Start Date End Date Taking? Authorizing Provider  omeprazole (PRILOSEC) 20 MG capsule Take 1 capsule (20 mg total) by mouth daily. 09/04/14  Yes April Palumbo, MD  diphenhydrAMINE (BENADRYL) 25 MG tablet Take 75 mg by mouth once as needed. For bee sting reaction    Historical Provider, MD   BP 111/61 mmHg  Pulse 67  Temp(Src) 98.5 F (36.9 C) (Oral)  Resp 18  Wt 175 lb (79.379 kg)  SpO2 100%   Physical Exam General: Well-developed, well-nourished male in no acute distress; appearance consistent with age of record HENT: normocephalic; atraumatic Eyes: pupils equal, round and reactive to light; extraocular muscles intact Neck: supple Heart: regular rate and  rhythm Lungs: clear to auscultation bilaterally Abdomen: soft; nondistended; periumbilical tenderness; no masses or hepatosplenomegaly; bowel sounds present Extremities: No deformity; full range of motion; pulses normal Neurologic: Awake, alert; motor function intact in all extremities and symmetric; no facial droop Skin: Warm and dry Psychiatric: Flat affect    ED Course  Procedures (including critical care time)   MDM  Nursing notes and vitals signs, including pulse oximetry, reviewed.  Summary of this visit's results, reviewed by myself:  Labs:  Results for orders placed or performed during the hospital encounter of 11/03/14 (from the past 24 hour(s))  CBC with Differential/Platelet     Status: Abnormal   Collection Time: 11/03/14  3:20 AM  Result Value Ref Range   WBC 12.1 (H) 4.0 - 10.5 K/uL   RBC 4.22 4.22 - 5.81 MIL/uL   Hemoglobin 7.3 (L) 13.0 - 17.0 g/dL   HCT 11.9 (L) 14.7 - 82.9 %   MCV 63.3 (L) 78.0 - 100.0 fL   MCH 17.3 (L) 26.0 - 34.0 pg   MCHC 27.3 (L) 30.0 - 36.0 g/dL   RDW 56.2 (H) 13.0 - 86.5 %   Platelets 453 (H) 150 - 400 K/uL   Neutrophils Relative % 88 (H) 43 - 77 %   Lymphocytes Relative 6 (L) 12 - 46 %   Monocytes Relative 5 3 - 12 %   Eosinophils Relative 1 0 - 5 %   Basophils Relative 0 0 - 1 %  Neutro Abs 10.7 (H) 1.7 - 7.7 K/uL   Lymphs Abs 0.7 0.7 - 4.0 K/uL   Monocytes Absolute 0.6 0.1 - 1.0 K/uL   Eosinophils Absolute 0.1 0.0 - 0.7 K/uL   Basophils Absolute 0.0 0.0 - 0.1 K/uL   RBC Morphology TARGET CELLS   Comprehensive metabolic panel     Status: Abnormal   Collection Time: 11/03/14  3:20 AM  Result Value Ref Range   Sodium 144 135 - 145 mmol/L   Potassium 3.9 3.5 - 5.1 mmol/L   Chloride 109 101 - 111 mmol/L   CO2 24 22 - 32 mmol/L   Glucose, Bld 105 (H) 65 - 99 mg/dL   BUN 20 6 - 20 mg/dL   Creatinine, Ser 0.45 (H) 0.61 - 1.24 mg/dL   Calcium 9.9 8.9 - 40.9 mg/dL   Total Protein 8.7 (H) 6.5 - 8.1 g/dL   Albumin 4.4 3.5 - 5.0  g/dL   AST 26 15 - 41 U/L   ALT 12 (L) 17 - 63 U/L   Alkaline Phosphatase 38 38 - 126 U/L   Total Bilirubin 0.3 0.3 - 1.2 mg/dL   GFR calc non Af Amer >60 >60 mL/min   GFR calc Af Amer >60 >60 mL/min   Anion gap 11 5 - 15  Lipase, blood     Status: None   Collection Time: 11/03/14  3:20 AM  Result Value Ref Range   Lipase 22 22 - 51 U/L   5:34 AM Drinking fluids without emesis. Advised of CBC showing what appears to be chronic iron deficiency anemia. His significant other relates a previous history of anemia and pica, specifically eating ice. He was advised that he needs to seek out a primary care physician to have this further if I waited and treated.    Paula Libra, MD 11/03/14 [phone]

## 2015-05-13 ENCOUNTER — Emergency Department (HOSPITAL_BASED_OUTPATIENT_CLINIC_OR_DEPARTMENT_OTHER)
Admission: EM | Admit: 2015-05-13 | Discharge: 2015-05-13 | Disposition: A | Discharge status: Home / Self Care | Payer: Self-pay | Attending: Emergency Medicine | Admitting: Emergency Medicine

## 2015-05-13 ENCOUNTER — Telehealth: Payer: Self-pay | Admitting: *Deleted

## 2015-05-13 ENCOUNTER — Encounter (HOSPITAL_BASED_OUTPATIENT_CLINIC_OR_DEPARTMENT_OTHER): Payer: Self-pay | Admitting: *Deleted

## 2015-05-13 DIAGNOSIS — R531 Weakness: Secondary | ICD-10-CM | POA: Insufficient documentation

## 2015-05-13 DIAGNOSIS — Z79899 Other long term (current) drug therapy: Secondary | ICD-10-CM | POA: Insufficient documentation

## 2015-05-13 DIAGNOSIS — J45909 Unspecified asthma, uncomplicated: Secondary | ICD-10-CM | POA: Insufficient documentation

## 2015-05-13 DIAGNOSIS — D649 Anemia, unspecified: Secondary | ICD-10-CM | POA: Insufficient documentation

## 2015-05-13 DIAGNOSIS — E860 Dehydration: Secondary | ICD-10-CM | POA: Insufficient documentation

## 2015-05-13 DIAGNOSIS — Z8719 Personal history of other diseases of the digestive system: Secondary | ICD-10-CM | POA: Insufficient documentation

## 2015-05-13 DIAGNOSIS — F17210 Nicotine dependence, cigarettes, uncomplicated: Secondary | ICD-10-CM | POA: Insufficient documentation

## 2015-05-13 HISTORY — DX: Anemia, unspecified: D64.9

## 2015-05-13 HISTORY — DX: Unspecified hemorrhoids: K64.9

## 2015-05-13 LAB — BASIC METABOLIC PANEL
Anion gap: 9 (ref 5–15)
BUN: 13 mg/dL (ref 6–20)
CHLORIDE: 104 mmol/L (ref 101–111)
CO2: 24 mmol/L (ref 22–32)
Calcium: 8.8 mg/dL — ABNORMAL LOW (ref 8.9–10.3)
Creatinine, Ser: 1.3 mg/dL — ABNORMAL HIGH (ref 0.61–1.24)
GFR calc Af Amer: >60 mL/min (ref >60)
GFR calc non Af Amer: >60 mL/min (ref >60)
Glucose, Bld: 122 mg/dL — ABNORMAL HIGH (ref 65–99)
POTASSIUM: 3.5 mmol/L (ref 3.5–5.1)
Sodium: 137 mmol/L (ref 135–145)

## 2015-05-13 LAB — FERRITIN: FERRITIN: 2 ng/mL — AB (ref 24–336)

## 2015-05-13 LAB — RETICULOCYTES
RBC.: 3.68 MIL/uL — ABNORMAL LOW (ref 4.22–5.81)
Retic Count, Absolute: 51.5 10*3/uL (ref 19.0–186.0)
Retic Ct Pct: 1.4 % (ref 0.4–3.1)

## 2015-05-13 LAB — VITAMIN B12: Vitamin B-12: 228 pg/mL (ref 180–914)

## 2015-05-13 LAB — CBC
HCT: 25.5 % — ABNORMAL LOW (ref 39.0–52.0)
HEMOGLOBIN: 7.6 g/dL — AB (ref 13.0–17.0)
MCH: 19.8 pg — ABNORMAL LOW (ref 26.0–34.0)
MCHC: 29.8 g/dL — ABNORMAL LOW (ref 30.0–36.0)
MCV: 66.6 fL — AB (ref 78.0–100.0)
PLATELETS: 357 10*3/uL (ref 150–400)
RBC: 3.83 MIL/uL — ABNORMAL LOW (ref 4.22–5.81)
RDW: 19.9 % — ABNORMAL HIGH (ref 11.5–15.5)
WBC: 4.1 10*3/uL (ref 4.0–10.5)

## 2015-05-13 LAB — FOLATE: Folate: 20.5 ng/mL (ref >5.9)

## 2015-05-13 LAB — IRON AND TIBC
Iron: 6 ug/dL — ABNORMAL LOW (ref 45–182)
SATURATION RATIOS: 1 % — AB (ref 17.9–39.5)
TIBC: 468 ug/dL — AB (ref 250–450)
UIBC: 462 ug/dL

## 2015-05-13 MED ORDER — SODIUM CHLORIDE 0.9 % IV BOLUS (SEPSIS)
1500.0000 mL | Freq: Once | INTRAVENOUS | Status: AC
  Administered 2015-05-13: 1500 mL via INTRAVENOUS

## 2015-05-13 MED ORDER — DOCUSATE SODIUM 100 MG PO CAPS: 100.0000 mg | ORAL_CAPSULE | Freq: Two times a day (BID) | ORAL | Status: DC

## 2015-05-13 MED ORDER — FERROUS SULFATE 325 (65 FE) MG PO TABS: 325.0000 mg | ORAL_TABLET | Freq: Three times a day (TID) | ORAL | Status: DC

## 2015-05-13 MED FILL — FERROUS SULFATE 325 MG TAB: 325 (65 FE) | 34 days supply | Qty: 100 | Fill #0

## 2015-05-13 MED FILL — DOK 100 MG SOFTGEL: 100 | 50 days supply | Qty: 100 | Fill #0

## 2015-05-13 NOTE — ED Provider Notes (Signed)
CSN: [id number]     Arrival date & time 05/13/15  0865 History   First MD Initiated Contact with Patient 05/13/15 1019     Chief Complaint  Patient presents with  . Dizziness      HPI Patient presents to the emergency department with complaints of "dizziness" over the past 2 days.  He states he feels lightheaded.  Today he was at work when he bent over and when he stood up he lightheaded and fell over.  He states that a small amount of ongoing slow GI blood loss over the past several years.  He states he is chronically anemic and is not taking iron supplementation as he is supposed to.  He denies active bleeding at this time.  Denies abdominal pain.  No preceding palpitations.  No active chest pain or shortness of breath.  No other complaints at this time.  He does report decreased oral intake over the past several days.   Past Medical History  Diagnosis Date  . Asthma   . Hemorrhoids   . Anemia    Past Surgical History  Procedure Laterality Date  . Appendectomy    . Finger surgery    . Cleft palate repair     No family history on file. Social History  Substance Use Topics  . Smoking status: Current Every Day Smoker    Types: Cigarettes  . Smokeless tobacco: Never Used  . Alcohol Use: Yes     Comment: daily    Review of Systems  All other systems reviewed and are negative.     Allergies  Shellfish allergy and Bee venom  Home Medications   Prior to Admission medications   Medication Sig Start Date End Date Taking? Authorizing Provider  docusate sodium (COLACE) 100 MG capsule Take 1 capsule (100 mg total) by mouth every 12 (twelve) hours. 05/13/15   Azalia Bilis, MD  ferrous sulfate 325 (65 FE) MG tablet Take 1 tablet (325 mg total) by mouth 3 (three) times daily with meals. 05/13/15   Azalia Bilis, MD  omeprazole (PRILOSEC) 20 MG capsule Take 1 capsule (20 mg total) by mouth daily. 09/04/14   April Palumbo, MD  ondansetron (ZOFRAN ODT) 8 MG disintegrating tablet Take 1  tablet (8 mg total) by mouth every 8 (eight) hours as needed for nausea or vomiting. 11/03/14   Paula Libra, MD   BP 118/67 mmHg  Pulse 98  Temp(Src) 98.4 F (36.9 C) (Oral)  Resp 16  Ht 5' 10.5" (1.791 m)  Wt 173 lb (78.472 kg)  BMI 24.46 kg/m2  SpO2 99% Physical Exam  Constitutional: He is oriented to person, place, and time. He appears well-developed and well-nourished.  HENT:  Head: Normocephalic and atraumatic.  Eyes: EOM are normal.  Neck: Normal range of motion.  Cardiovascular: Normal rate, regular rhythm, normal heart sounds and intact distal pulses.   Pulmonary/Chest: Effort normal and breath sounds normal. No respiratory distress.  Abdominal: Soft. He exhibits no distension. There is no tenderness.  Musculoskeletal: Normal range of motion.  Neurological: He is alert and oriented to person, place, and time.  Skin: Skin is warm and dry.  Psychiatric: He has a normal mood and affect. Judgment normal.  Nursing note and vitals reviewed.   ED Course  Procedures (including critical care time) Labs Review Labs Reviewed  CBC - Abnormal; Notable for the following:    RBC 3.83 (*)    Hemoglobin 7.6 (*)    HCT 25.5 (*)    MCV  66.6 (*)    MCH 19.8 (*)    MCHC 29.8 (*)    RDW 19.9 (*)    All other components within normal limits  BASIC METABOLIC PANEL - Abnormal; Notable for the following:    Glucose, Bld 122 (*)    Creatinine, Ser 1.30 (*)    Calcium 8.8 (*)    All other components within normal limits  VITAMIN B12  FOLATE  IRON AND TIBC  FERRITIN  RETICULOCYTES    Imaging Review No results found. I have personally reviewed and evaluated these images and lab results as part of my medical decision-making.   EKG Interpretation   Date/Time:  Monday May 13 2015 10:04:20 EST Ventricular Rate:  96 PR Interval:  108 QRS Duration: 86 QT Interval:  372 QTC Calculation: 469 R Axis:   82 Text Interpretation:  Sinus rhythm with short PR Otherwise normal ECG No   significant change was found Confirmed by Esai Stecklein  MD, Floria Brandau ([id number]) on  05/13/2015 10:10:46 AM      MDM   Final diagnoses:  Weakness  Chronic anemia  Dehydration    Feels better after IV fluids.  Vital signs are normal.  Anemia consistent with prior hemoglobins.  Chronic anemia.  Anemia panel sent.  Patient need to follow-up with hematology.  I'll start the patient on iron supplementation at this time.  Primary care follow-up.    Azalia Bilis, MD 05/13/15 1215

## 2015-05-13 NOTE — Discharge Instructions (Signed)
Anemia, Nonspecific Anemia is a condition in which the concentration of red blood cells or hemoglobin in the blood is below normal. Hemoglobin is a substance in red blood cells that carries oxygen to the tissues of the body. Anemia results in not enough oxygen reaching these tissues.  CAUSES  Common causes of anemia include:   Excessive bleeding. Bleeding may be internal or external. This includes excessive bleeding from periods (in women) or from the intestine.   Poor nutrition.   Chronic kidney, thyroid, and liver disease.  Bone marrow disorders that decrease red blood cell production.  Cancer and treatments for cancer.  HIV, AIDS, and their treatments.  Spleen problems that increase red blood cell destruction.  Blood disorders.  Excess destruction of red blood cells due to infection, medicines, and autoimmune disorders. SIGNS AND SYMPTOMS   Minor weakness.   Dizziness.   Headache.  Palpitations.   Shortness of breath, especially with exercise.   Paleness.  Cold sensitivity.  Indigestion.  Nausea.  Difficulty sleeping.  Difficulty concentrating. Symptoms may occur suddenly or they may develop slowly.  DIAGNOSIS  Additional blood tests are often needed. These help your health care provider determine the best treatment. Your health care provider will check your stool for blood and look for other causes of blood loss.  TREATMENT  Treatment varies depending on the cause of the anemia. Treatment can include:   Supplements of iron, vitamin B12, or folic acid.   Hormone medicines.   A blood transfusion. This may be needed if blood loss is severe.   Hospitalization. This may be needed if there is significant continual blood loss.   Dietary changes.  Spleen removal. HOME CARE INSTRUCTIONS Keep all follow-up appointments. It often takes many weeks to correct anemia, and having your health care provider check on your condition and your response to  treatment is very important. SEEK IMMEDIATE MEDICAL CARE IF:   You develop extreme weakness, shortness of breath, or chest pain.   You become dizzy or have trouble concentrating.  You develop heavy vaginal bleeding.   You develop a rash.   You have bloody or black, tarry stools.   You faint.   You vomit up blood.   You vomit repeatedly.   You have abdominal pain.  You have a fever or persistent symptoms for more than 2-3 days.   You have a fever and your symptoms suddenly get worse.   You are dehydrated.  MAKE SURE YOU:  Understand these instructions.  Will watch your condition.  Will get help right away if you are not doing well or get worse.   This information is not intended to replace advice given to you by your health care provider. Make sure you discuss any questions you have with your health care provider.   Document Released: 04/09/2004 Document Revised: 11/02/2012 Document Reviewed: 08/26/2012 Elsevier Interactive Patient Education 2016 [employer]  

## 2015-05-13 NOTE — Telephone Encounter (Signed)
Received a request for this office to follow up with this patient after a visit to the ED this morning.  Patient does want to be seen, however he will not schedule at this time. He states he needs to work out other appointments and he can't keep missing work. He prefers evening hours. It was explained that the office doesn't have evening hours, and that his first appointment as a new patient would have to occur in the early afternoon at the latest. He understood but continues to not want to schedule at this time. He has the number to the office to call back.  He needs appointments to be scheduled for [facility] New Patient with NP 1hr28min infusion appointment for likely feraheme

## 2015-05-13 NOTE — ED Notes (Signed)
Pt sts he has felt dizzy for the last 2 days.  This am whiel at work he bent over to pick something up and got dizzy and fell over. Pt sts he has been passing blood for 2 years due to internal hemorrhoids.

## 2015-05-21 ENCOUNTER — Other Ambulatory Visit: Payer: Self-pay | Admitting: Nurse Practitioner

## 2015-05-21 ENCOUNTER — Ambulatory Visit: Payer: Self-pay

## 2015-05-21 MED ORDER — NONFORMULARY OR COMPOUNDED ITEM: 0.0000 | Freq: Once | Status: DC

## 2015-05-22 ENCOUNTER — Ambulatory Visit: Payer: Self-pay

## 2015-05-23 ENCOUNTER — Ambulatory Visit: Payer: Self-pay

## 2015-05-23 ENCOUNTER — Ambulatory Visit: Payer: Self-pay | Admitting: Family

## 2015-05-23 ENCOUNTER — Other Ambulatory Visit: Payer: Self-pay

## 2015-05-27 ENCOUNTER — Encounter: Payer: Self-pay | Admitting: Family

## 2015-05-27 ENCOUNTER — Ambulatory Visit (HOSPITAL_BASED_OUTPATIENT_CLINIC_OR_DEPARTMENT_OTHER): Payer: Self-pay | Admitting: Family

## 2015-05-27 ENCOUNTER — Other Ambulatory Visit (HOSPITAL_BASED_OUTPATIENT_CLINIC_OR_DEPARTMENT_OTHER): Payer: Self-pay

## 2015-05-27 ENCOUNTER — Ambulatory Visit (HOSPITAL_BASED_OUTPATIENT_CLINIC_OR_DEPARTMENT_OTHER): Payer: Self-pay

## 2015-05-27 VITALS — BP 142/79 | HR 85 | Temp 98.2°F | Resp 18

## 2015-05-27 VITALS — BP 134/83 | HR 90 | Temp 98.3°F | Resp 16 | Ht 70.0 in | Wt 173.0 lb

## 2015-05-27 DIAGNOSIS — K644 Residual hemorrhoidal skin tags: Secondary | ICD-10-CM

## 2015-05-27 DIAGNOSIS — D500 Iron deficiency anemia secondary to blood loss (chronic): Secondary | ICD-10-CM

## 2015-05-27 DIAGNOSIS — R0602 Shortness of breath: Secondary | ICD-10-CM

## 2015-05-27 DIAGNOSIS — D508 Other iron deficiency anemias: Secondary | ICD-10-CM

## 2015-05-27 DIAGNOSIS — R209 Unspecified disturbances of skin sensation: Secondary | ICD-10-CM

## 2015-05-27 DIAGNOSIS — K648 Other hemorrhoids: Secondary | ICD-10-CM

## 2015-05-27 DIAGNOSIS — R61 Generalized hyperhidrosis: Secondary | ICD-10-CM

## 2015-05-27 DIAGNOSIS — R5383 Other fatigue: Secondary | ICD-10-CM

## 2015-05-27 DIAGNOSIS — R42 Dizziness and giddiness: Secondary | ICD-10-CM

## 2015-05-27 LAB — CBC WITH DIFFERENTIAL (CANCER CENTER ONLY)
BASO#: 0 10*3/uL (ref 0.0–0.2)
BASO%: 0.8 % (ref 0.0–2.0)
EOS ABS: 0.2 10*3/uL (ref 0.0–0.5)
EOS%: 3.9 % (ref 0.0–7.0)
HEMATOCRIT: 33.8 % — AB (ref 38.7–49.9)
HEMOGLOBIN: 10.2 g/dL — AB (ref 13.0–17.1)
LYMPH#: 1.2 10*3/uL (ref 0.9–3.3)
LYMPH%: 25.6 % (ref 14.0–48.0)
MCH: 23.3 pg — AB (ref 28.0–33.4)
MCHC: 30.2 g/dL — ABNORMAL LOW (ref 32.0–35.9)
MCV: 77 fL — AB (ref 82–98)
MONO#: 0.6 10*3/uL (ref 0.1–0.9)
MONO%: 12.4 % (ref 0.0–13.0)
NEUT%: 57.3 % (ref 40.0–80.0)
NEUTROS ABS: 2.8 10*3/uL (ref 1.5–6.5)
Platelets: 310 10*3/uL (ref 145–400)
RBC: 4.37 10*6/uL (ref 4.20–5.70)
WBC: 4.8 10*3/uL (ref 4.0–10.0)

## 2015-05-27 LAB — CHCC SATELLITE - SMEAR

## 2015-05-27 LAB — COMPREHENSIVE METABOLIC PANEL
ALT: 15 U/L (ref 0–55)
ANION GAP: 9 meq/L (ref 3–11)
AST: 20 U/L (ref 5–34)
Albumin: 3.9 g/dL (ref 3.5–5.0)
Alkaline Phosphatase: 56 U/L (ref 40–150)
BILIRUBIN TOTAL: 0.38 mg/dL (ref 0.20–1.20)
BUN: 12.7 mg/dL (ref 7.0–26.0)
CALCIUM: 9 mg/dL (ref 8.4–10.4)
CHLORIDE: 108 meq/L (ref 98–109)
CO2: 23 mEq/L (ref 22–29)
CREATININE: 1.6 mg/dL — AB (ref 0.7–1.3)
EGFR: 60 mL/min/{1.73_m2} — ABNORMAL LOW (ref >90)
Glucose: 74 mg/dl (ref 70–140)
Potassium: 4.1 mEq/L (ref 3.5–5.1)
Sodium: 140 mEq/L (ref 136–145)
TOTAL PROTEIN: 7.6 g/dL (ref 6.4–8.3)

## 2015-05-27 LAB — LACTATE DEHYDROGENASE: LDH: 169 U/L (ref 125–245)

## 2015-05-27 MED ORDER — SODIUM CHLORIDE 0.9 % IV SOLN
Freq: Once | INTRAVENOUS | Status: AC
  Administered 2015-05-27: 15:00:00 via INTRAVENOUS

## 2015-05-27 MED ORDER — FERUMOXYTOL INJECTION 510 MG/17 ML
510.0000 mg | Freq: Once | INTRAVENOUS | Status: AC
Start: 2015-05-27 — End: 2015-05-27
  Administered 2015-05-27: 510 mg via INTRAVENOUS
  Filled 2015-05-27: qty 17

## 2015-05-27 NOTE — Progress Notes (Signed)
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 He states that he has internal and external hemorrhoids that bleed on a regular basis. He has recently been symptomatic with fatigue, SOB, dizziness, night sweats and numbness/tingling in his arms and legs that comes and goes.  He is currently taking an oral iron supplement and states that his stool is black so he can not tell if there is any new blood. No constipation or diarrhea.  He c/o of itching with the iron supplement.  His iron saturation in the ED several weeks ago was 6% with a ferritin of 2.  He has also been using preparation H cream and Ibuprofen. He will stop the ibuprofen and use Tylenol instead.  He has never had an endoscopy or colonoscopy. He has never been transfused.  No anemia history with his family that he is aware of. His brother has the sickle cell trait but he does not. No sickle cell disease that he knows of.  No personal cancer history. Family cancer history includes maternal grandmother and 2 uncles with unknown primaries.  No fever, chills, n/v, cough, chest pain, abdominal pain or changes in bladder habits.  He has maintained a good appetite and is staying well hydrated. He denies any significant weight loss or gain.  He smokes 3-4 cigarettes daily and enjoys beer each evening and socially.  He was recently laid off due to his health issues. He was formerly working ain a Art therapist in desks.   ROS: All other 10 point review of systems is negative.   PAST MEDICAL HISTORY:   Past Medical History  Diagnosis Date  . Asthma    . Hemorrhoids   . Anemia     ALLERGIES: Allergies  Allergen Reactions  . Shellfish Allergy Anaphylaxis  . Bee Venom Swelling      MEDICATIONS:  Current Outpatient Prescriptions on File Prior to Visit  Medication Sig Dispense Refill  . docusate sodium (COLACE) 100 MG capsule Take 1 capsule (100 mg total) by mouth every 12 (twelve) hours. 60 capsule 0  . ferrous sulfate 325 (65 FE) MG tablet Take 1 tablet (325 mg total) by mouth 3 (three) times daily with meals. 90 tablet 0  . omeprazole (PRILOSEC) 20 MG capsule Take 1 capsule (20 mg total) by mouth daily. 30 capsule 0  . ondansetron (ZOFRAN ODT) 8 MG disintegrating tablet Take 1 tablet (8 mg total) by mouth every 8 (eight) hours as needed for nausea or vomiting. 10 tablet 0   No current facility-administered medications on file prior to visit.     PAST SURGICAL HISTORY Past Surgical History  Procedure Laterality Date  . Appendectomy    . Finger surgery    . Cleft palate repair      FAMILY HISTORY: No family history on file.  SOCIAL HISTORY:  reports that he has been smoking Cigarettes.  He has never used smokeless tobacco. He reports that he drinks alcohol. He reports that he uses illicit drugs (Marijuana and Cocaine).  PERFORMANCE STATUS: The patient's performance status is 1 - Symptomatic but completely ambulatory  PHYSICAL EXAM: Most Recent Vital Signs: There were no vitals taken for this visit. BP 134/83 mmHg  Pulse 90  Temp(Src) 98.3 F (36.8 C) (Oral)  Resp 16  Ht 5\' 10"  (1.778 m)  Wt 173 lb (78.472 kg)  BMI 24.82 kg/m2  General Appearance:    Alert, cooperative, no distress, appears stated age  Head:    Normocephalic, without obvious abnormality, atraumatic  Eyes:    PERRL, conjunctiva/corneas clear, EOM's intact, fundi    benign, both eyes             Throat:   Lips, mucosa, and tongue normal; teeth and gums normal  Neck:   Supple, symmetrical, trachea midline, no adenopathy;       thyroid:  No  enlargement/tenderness/nodules; no carotid   bruit or JVD  Back:     Symmetric, no curvature, ROM normal, no CVA tenderness  Lungs:     Clear to auscultation bilaterally, respirations unlabored  Chest wall:    No tenderness or deformity  Heart:    Regular rate and rhythm, S1 and S2 normal, no murmur, rub   or gallop  Abdomen:     Soft, non-tender, bowel sounds active all four quadrants,    no masses, no organomegaly        Extremities:   Extremities normal, atraumatic, no cyanosis or edema  Pulses:   2+ and symmetric all extremities  Skin:   Skin color, texture, turgor normal, no rashes or lesions  Lymph nodes:   Cervical, supraclavicular, and axillary nodes normal  Neurologic:   CNII-XII intact. Normal strength, sensation and reflexes      throughout   LABORATORY DATA:  Results for orders placed or performed in visit on 05/27/15 (from the past 48 hour(s))  [facility] - Smear     Status: None   Collection Time: 05/27/15  1:24 PM  Result Value Ref Range   Smear Result Smear Available   CBC with Differential [facility])     Status: Abnormal   Collection Time: 05/27/15  1:24 PM  Result Value Ref Range   WBC 4.8 4.0 - 10.0 10e3/uL   RBC 4.37 4.20 - 5.70 10e6/uL   HGB 10.2 (L) 13.0 - 17.1 g/dL   HCT 40.933.8 (L) 81.138.7 - 91.449.9 %   MCV 77 (L) 82 - 98 fL   MCH 23.3 (L) 28.0 - 33.4 pg   MCHC 30.2 (L) 32.0 - 35.9 g/dL   Platelets 782310 [id number] - 213400 10e3/uL   NEUT# 2.8 1.5 - 6.5 10e3/uL   LYMPH# 1.2 0.9 - 3.3 10e3/uL   MONO# 0.6 0.1 - 0.9 10e3/uL   Eosinophils Absolute 0.2 0.0 - 0.5 10e3/uL   BASO# 0.0 0.0 - 0.2 10e3/uL   NEUT% 57.3 40.0 - 80.0 %   LYMPH% 25.6 14.0 - 48.0 %   MONO% 12.4 0.0 - 13.0 %   EOS% 3.9 0.0 - 7.0 %   BASO% 0.8 0.0 - 2.0 %      RADIOGRAPHY: No results found.     PATHOLOGY: None  ASSESSMENT/PLAN: Mr. Matthew Oneal is a very pleasant 40 yo African American gentleman with a long history of anemia due to chronic blood loss. He states that he has internal and  external hemorrhoids that bleed on a regular basis for the last 4-5 years but has never had a colonoscopy. He is currently symptomatic with fatigue, SOB, dizziness, night sweats and numbness/tingling in his arms and legs.  His iron studies several weeks ago showed a  saturation of 1% and ferritin of 2. Studies today pending.  We will go ahead and give him a dose of feraheme at this time and plan to do another in 8 days.  Dr. Myna Hidalgo will also speak with GI and see if there is someone willing to do a colonoscopy on him without insurance.  We will plan to see him back in 6 weeks for follow-up.  All questions were answered. He  knows to contact our office with any problems, questions or concerns. We can certainly see him much sooner if necessary.  He was discussed with and also seen by Dr. Myna Hidalgo and he is in agreement with the aforementioned.   [facility]    Addendum:   I saw and examined the patient with Sarah. He clearly is iron deficient. He definitely needs to have sometime a GI workup. Unfortunately , because of his lack of insurance, this is been a real hindrance. I will call one gastroenterologist and see if they will see him.  I think we probably do need to check for sickle cell. Apparently had a brother has sickle cell trait. The patient does not think that he has sickle cell trait.  I looked at his blood smear area and he has a couple target cells. I did not see anything else unusual.  On his physical exam, I do not see anything that looked suspicious.   He is in good shape. I want to make sure that we continue to help him.  Again, he definitely needs to have some type of GI evaluation. I do still want to assume that the bleeding is from hemorrhoids. Although I cannot see anything on his exam that is suspicious, I really think that a thorough GI evaluation would be reasonable.  We spent about 40 minutes with him. We answered all his questions. He is very nice  We will go  ahead and give him iron. He is a couple doses of iron.  He just got married. I want him to go to enjoy married life. I want him to be able to work and not have to keep going out because of bleeding and weakness.  Hewitt Shorts

## 2015-05-27 NOTE — Patient Instructions (Signed)

## 2015-05-28 ENCOUNTER — Telehealth: Payer: Self-pay | Admitting: *Deleted

## 2015-05-28 ENCOUNTER — Ambulatory Visit: Payer: Self-pay

## 2015-05-28 DIAGNOSIS — J4520 Mild intermittent asthma, uncomplicated: Secondary | ICD-10-CM

## 2015-05-28 LAB — HAPTOGLOBIN: Haptoglobin: 139 mg/dL (ref 34–200)

## 2015-05-28 LAB — IRON AND TIBC
%SAT: 23 % (ref 20–55)
IRON: 81 ug/dL (ref 42–163)
TIBC: 356 ug/dL (ref 202–409)
UIBC: 275 ug/dL (ref 117–376)

## 2015-05-28 LAB — RETICULOCYTES: Reticulocyte Count: 2 % (ref 0.6–2.6)

## 2015-05-28 LAB — FERRITIN: Ferritin: 28 ng/ml (ref 22–316)

## 2015-05-28 MED ORDER — IPRATROPIUM-ALBUTEROL 20-100 MCG/ACT IN AERS: 1.0000 | INHALATION_SPRAY | Freq: Four times a day (QID) | RESPIRATORY_TRACT | Status: DC | PRN

## 2015-05-28 NOTE — Telephone Encounter (Signed)
Patient would like this office to provide a prescription for albuterol inhaler. He states he does not have a PCP at this time.   Spoke to Dr Myna HidalgoEnnever. He will prescribe the inhaler one time while patient is arranging for PCP. Prescription sent. Patient aware.

## 2015-06-03 ENCOUNTER — Ambulatory Visit (HOSPITAL_BASED_OUTPATIENT_CLINIC_OR_DEPARTMENT_OTHER): Payer: Self-pay

## 2015-06-03 ENCOUNTER — Other Ambulatory Visit: Payer: Self-pay | Admitting: *Deleted

## 2015-06-03 VITALS — BP 135/88 | HR 72 | Temp 97.6°F | Resp 16

## 2015-06-03 DIAGNOSIS — D500 Iron deficiency anemia secondary to blood loss (chronic): Secondary | ICD-10-CM

## 2015-06-03 DIAGNOSIS — K648 Other hemorrhoids: Secondary | ICD-10-CM

## 2015-06-03 DIAGNOSIS — K644 Residual hemorrhoidal skin tags: Secondary | ICD-10-CM

## 2015-06-03 MED ORDER — SODIUM CHLORIDE 0.9 % IV SOLN
510.0000 mg | Freq: Once | INTRAVENOUS | Status: AC
  Administered 2015-06-03: 510 mg via INTRAVENOUS
  Filled 2015-06-03: qty 17

## 2015-06-03 MED ORDER — FERUMOXYTOL INJECTION 510 MG/17 ML: 510.0000 mg | INTRAVENOUS | Status: DC | PRN

## 2015-06-03 MED ORDER — SODIUM CHLORIDE 0.9 % IV SOLN
Freq: Once | INTRAVENOUS | Status: AC
  Administered 2015-06-03: 09:00:00 via INTRAVENOUS

## 2015-06-03 NOTE — Patient Instructions (Signed)

## 2015-07-15 ENCOUNTER — Other Ambulatory Visit: Payer: Self-pay

## 2015-07-15 ENCOUNTER — Ambulatory Visit: Payer: Self-pay | Admitting: Family

## 2016-06-17 ENCOUNTER — Emergency Department (HOSPITAL_BASED_OUTPATIENT_CLINIC_OR_DEPARTMENT_OTHER)
Admission: EM | Admit: 2016-06-17 | Discharge: 2016-06-17 | Disposition: A | Discharge status: Home / Self Care | Payer: Self-pay | Attending: Emergency Medicine | Admitting: Emergency Medicine

## 2016-06-17 ENCOUNTER — Encounter (HOSPITAL_BASED_OUTPATIENT_CLINIC_OR_DEPARTMENT_OTHER): Payer: Self-pay | Admitting: *Deleted

## 2016-06-17 DIAGNOSIS — D649 Anemia, unspecified: Secondary | ICD-10-CM | POA: Insufficient documentation

## 2016-06-17 DIAGNOSIS — K625 Hemorrhage of anus and rectum: Secondary | ICD-10-CM

## 2016-06-17 DIAGNOSIS — J45909 Unspecified asthma, uncomplicated: Secondary | ICD-10-CM | POA: Insufficient documentation

## 2016-06-17 DIAGNOSIS — F17210 Nicotine dependence, cigarettes, uncomplicated: Secondary | ICD-10-CM | POA: Insufficient documentation

## 2016-06-17 LAB — COMPREHENSIVE METABOLIC PANEL
ALT: 21 U/L (ref 17–63)
AST: 33 U/L (ref 15–41)
Albumin: 4 g/dL (ref 3.5–5.0)
Alkaline Phosphatase: 49 U/L (ref 38–126)
Anion gap: 7 (ref 5–15)
BUN: 20 mg/dL (ref 6–20)
CHLORIDE: 108 mmol/L (ref 101–111)
CO2: 22 mmol/L (ref 22–32)
CREATININE: 1.03 mg/dL (ref 0.61–1.24)
Calcium: 9 mg/dL (ref 8.9–10.3)
GFR calc Af Amer: >60 mL/min (ref >60)
Glucose, Bld: 96 mg/dL (ref 65–99)
Potassium: 4 mmol/L (ref 3.5–5.1)
Sodium: 137 mmol/L (ref 135–145)
Total Bilirubin: 0.3 mg/dL (ref 0.3–1.2)
Total Protein: 7.1 g/dL (ref 6.5–8.1)

## 2016-06-17 LAB — CBC WITH DIFFERENTIAL/PLATELET
BASOS ABS: 0 10*3/uL (ref 0.0–0.1)
Basophils Relative: 1 %
EOS PCT: 4 %
Eosinophils Absolute: 0.2 10*3/uL (ref 0.0–0.7)
HCT: 30.4 % — ABNORMAL LOW (ref 39.0–52.0)
Hemoglobin: 9.9 g/dL — ABNORMAL LOW (ref 13.0–17.0)
LYMPHS PCT: 32 %
Lymphs Abs: 1.4 10*3/uL (ref 0.7–4.0)
MCH: 26.5 pg (ref 26.0–34.0)
MCHC: 32.6 g/dL (ref 30.0–36.0)
MCV: 81.5 fL (ref 78.0–100.0)
Monocytes Absolute: 0.5 10*3/uL (ref 0.1–1.0)
Monocytes Relative: 12 %
NEUTROS ABS: 2.3 10*3/uL (ref 1.7–7.7)
NEUTROS PCT: 51 %
PLATELETS: 299 10*3/uL (ref 150–400)
RBC: 3.73 MIL/uL — AB (ref 4.22–5.81)
RDW: 18.9 % — ABNORMAL HIGH (ref 11.5–15.5)
WBC: 4.4 10*3/uL (ref 4.0–10.5)

## 2016-06-17 LAB — URINALYSIS, ROUTINE W REFLEX MICROSCOPIC
BILIRUBIN URINE: NEGATIVE
GLUCOSE, UA: NEGATIVE mg/dL
HGB URINE DIPSTICK: NEGATIVE
Ketones, ur: NEGATIVE mg/dL
Leukocytes, UA: NEGATIVE
Nitrite: NEGATIVE
PH: 5.5 (ref 5.0–8.0)
Protein, ur: NEGATIVE mg/dL
SPECIFIC GRAVITY, URINE: 1.021 (ref 1.005–1.030)

## 2016-06-17 NOTE — ED Triage Notes (Signed)
Pt reports hx of rectal bleeding and iron infusion.  Reports rectal bleeding x 1 year.  States that he has had increased fatigue.

## 2016-06-17 NOTE — ED Notes (Signed)
ED Provider at bedside. 

## 2016-06-17 NOTE — ED Notes (Signed)
Ice chips given to Pt per [employer] ok

## 2016-06-17 NOTE — ED Provider Notes (Signed)
[facility]   CSN: [id number] Arrival date & time: 06/17/16  1724     History   Chief Complaint Chief Complaint  Patient presents with  . Rectal Bleeding    HPI Matthew Oneal is a 41 y.o. male.  HPI Patient with history of chronic rectal bleeding for greater than 1 year. States he's having bright red blood per rectum. He has occasional dark clots. Denies any rectal pain, abdominal pain. Has had increased dyspnea with exertion and generalized fatigue. States he's required blood transfusions in the past. Has yet to follow-up with a gastroenterologist. Past Medical History:  Diagnosis Date  . Anemia   . Asthma   . Hemorrhoids     Patient Active Problem List   Diagnosis Date Noted  . Iron deficiency anemia due to chronic blood loss 05/27/2015    Past Surgical History:  Procedure Laterality Date  . APPENDECTOMY    . CLEFT PALATE REPAIR    . FINGER SURGERY         Home Medications    Prior to Admission medications   Medication Sig Start Date End Date Taking? Authorizing Provider  ferrous sulfate 325 (65 FE) MG tablet Take 1 tablet (325 mg total) by mouth 3 (three) times daily with meals. 05/13/15   Azalia Bilis, MD    Family History History reviewed. No pertinent family history.  Social History Social History  Substance Use Topics  . Smoking status: Current Every Day Smoker    Types: Cigarettes  . Smokeless tobacco: Never Used  . Alcohol use 0.0 oz/week     Comment: daily     Allergies   Shellfish allergy and Bee venom   Review of Systems Review of Systems  Constitutional: Positive for fatigue. Negative for chills and fever.  Cardiovascular: Negative for chest pain, palpitations and leg swelling.  Gastrointestinal: Positive for anal bleeding. Negative for abdominal pain, constipation, diarrhea, nausea, rectal pain and vomiting.  Genitourinary: Negative for dysuria, flank pain and frequency.  Musculoskeletal: Negative for  back pain, myalgias, neck pain and neck stiffness.  Skin: Negative for rash and wound.  Neurological: Positive for weakness (generalized) and light-headedness. Negative for dizziness, numbness and headaches.  All other systems reviewed and are negative.    Physical Exam Updated Vital Signs BP (!) 129/98 (BP Location: Right Arm)   Pulse 75   Temp 98.3 F (36.8 C) (Oral)   Resp 18   Ht  (1.778 m)   Wt 175 lb (79.4 kg)   SpO2 100%   BMI 25.11 kg/m   Physical Exam  Constitutional: He is oriented to person, place, and time. He appears well-developed and well-nourished. No distress.  HENT:  Head: Normocephalic and atraumatic.  Mouth/Throat: Oropharynx is clear and moist.  Eyes: EOM are normal. Pupils are equal, round, and reactive to light.  Neck: Normal range of motion. Neck supple.  Cardiovascular: Normal rate and regular rhythm.  Exam reveals no gallop and no friction rub.   No murmur heard. Pulmonary/Chest: Effort normal and breath sounds normal. No respiratory distress. He has no wheezes. He has no rales. He exhibits no tenderness.  Abdominal: Soft. Bowel sounds are normal. There is no tenderness. There is no rebound and no guarding.  Genitourinary:  Genitourinary Comments: Brown stool on rectal exam. No rectal tenderness. No identified fissures. No appreciated enlarged internal or external hemorrhoids.  Musculoskeletal: Normal range of motion. He exhibits no edema or tenderness.  Neurological: He is alert and oriented to person,  place, and time.  Moving all extremities without focal weakness. Sensation intact.  Skin: Skin is warm and dry. No rash noted. No erythema.  Psychiatric: He has a normal mood and affect. His behavior is normal.  Nursing note and vitals reviewed.    ED Treatments / Results  Labs (all labs ordered are listed, but only abnormal results are displayed) Labs Reviewed  CBC WITH DIFFERENTIAL/PLATELET - Abnormal; Notable for the following:        Result Value   RBC 3.73 (*)    Hemoglobin 9.9 (*)    HCT 30.4 (*)    RDW 18.9 (*)    All other components within normal limits  COMPREHENSIVE METABOLIC PANEL  URINALYSIS, ROUTINE W REFLEX MICROSCOPIC    EKG  EKG Interpretation None       Radiology No results found.  Procedures Procedures (including critical care time)  Medications Ordered in ED Medications - No data to display   Initial Impression / Assessment and Plan / ED Course  I have reviewed the triage vital signs and the nursing notes.  Pertinent labs & imaging results that were available during my care of the patient were reviewed by me and considered in my medical decision making (see chart for details).     Hemoglobin is 9.9. Vital signs are stable. Patient is advised to follow-up closely with gastroenterologist. Return precautions given.  Final Clinical Impressions(s) / ED Diagnoses   Final diagnoses:  Rectal bleeding  Anemia, unspecified type    New Prescriptions Discharge Medication List as of 06/17/2016  7:20 PM       Loren Racer, MD 06/17/16 2330

## 2016-08-12 ENCOUNTER — Encounter (HOSPITAL_BASED_OUTPATIENT_CLINIC_OR_DEPARTMENT_OTHER): Payer: Self-pay

## 2016-08-12 ENCOUNTER — Emergency Department (HOSPITAL_BASED_OUTPATIENT_CLINIC_OR_DEPARTMENT_OTHER)
Admission: EM | Admit: 2016-08-12 | Discharge: 2016-08-12 | Disposition: A | Discharge status: Home / Self Care | Payer: Self-pay | Attending: Emergency Medicine | Admitting: Emergency Medicine

## 2016-08-12 DIAGNOSIS — M545 Low back pain: Secondary | ICD-10-CM

## 2016-08-12 DIAGNOSIS — G8929 Other chronic pain: Secondary | ICD-10-CM | POA: Insufficient documentation

## 2016-08-12 DIAGNOSIS — J45909 Unspecified asthma, uncomplicated: Secondary | ICD-10-CM | POA: Insufficient documentation

## 2016-08-12 DIAGNOSIS — K2950 Unspecified chronic gastritis without bleeding: Secondary | ICD-10-CM | POA: Insufficient documentation

## 2016-08-12 DIAGNOSIS — F17210 Nicotine dependence, cigarettes, uncomplicated: Secondary | ICD-10-CM | POA: Insufficient documentation

## 2016-08-12 MED ORDER — KETOROLAC TROMETHAMINE 60 MG/2ML IM SOLN
60.0000 mg | Freq: Once | INTRAMUSCULAR | Status: AC
  Administered 2016-08-12: 60 mg via INTRAMUSCULAR
  Filled 2016-08-12: qty 2

## 2016-08-12 MED ORDER — OMEPRAZOLE 20 MG PO CPDR: 20.0000 mg | DELAYED_RELEASE_CAPSULE | Freq: Every day | ORAL | 0 refills | Status: DC

## 2016-08-12 MED ORDER — LIDOCAINE 5 % EX PTCH: 1.0000 | MEDICATED_PATCH | CUTANEOUS | 0 refills | Status: DC

## 2016-08-12 MED ORDER — METHOCARBAMOL 500 MG PO TABS: 500.0000 mg | ORAL_TABLET | Freq: Four times a day (QID) | ORAL | 0 refills | Status: DC | PRN

## 2016-08-12 MED FILL — METHOCARBAMOL 500 MG TABLET: 500 | 3 days supply | Qty: 20 | Fill #0

## 2016-08-12 NOTE — ED Triage Notes (Signed)
C/o lower back pain x 1 month-denies injury-NAD-slow gait

## 2016-08-12 NOTE — ED Provider Notes (Signed)
[facility]   CSN: [id number] Arrival date & time: 08/12/16  1056     History   Chief Complaint Chief Complaint  Patient presents with  . Back Pain    HPI Matthew Oneal is a 41 y.o. male.  HPI   Pt p/w 1 month of low back pain that is worse with movement and lifting.  Does a lot of heavy lifting and bending for his job.  Pain is sharp and occasionally shoots into his legs.  Also has some tingling that foes into his legs. Has been taking tylenol and NSAIDs with increase in gastritis symptoms but not helping his back pain.  Denies fevers, chills, loss of control of bowel or bladder, weakness of numbness of the extremities, saddle anesthesia, bowel, urinary complaints.   Denies falls.   Past Medical History:  Diagnosis Date  . Anemia   . Asthma   . Hemorrhoids     Patient Active Problem List   Diagnosis Date Noted  . Iron deficiency anemia due to chronic blood loss 05/27/2015    Past Surgical History:  Procedure Laterality Date  . APPENDECTOMY    . CLEFT PALATE REPAIR    . FINGER SURGERY         Home Medications    Prior to Admission medications   Medication Sig Start Date End Date Taking? Authorizing Provider  ferrous sulfate 325 (65 FE) MG tablet Take 1 tablet (325 mg total) by mouth 3 (three) times daily with meals. 05/13/15   Azalia Bilisampos, Kevin, MD  lidocaine (LIDODERM) 5 % Place 1 patch onto the skin daily. Remove & Discard patch within 12 hours or as directed by MD 08/12/16   Trixie DredgeWest, Almon Whitford, PA-C  methocarbamol (ROBAXIN) 500 MG tablet Take 1-2 tablets (500-1,000 mg total) by mouth every 6 (six) hours as needed for muscle spasms (and pain). 08/12/16   Trixie DredgeWest, Adira Limburg, PA-C  omeprazole (PRILOSEC) 20 MG capsule Take 1 capsule (20 mg total) by mouth daily. 08/12/16   Trixie DredgeWest, Kyliegh Jester, PA-C    Family History No family history on file.  Social History Social History  Substance Use Topics  . Smoking status: Current Every Day Smoker    Types:  Cigarettes  . Smokeless tobacco: Never Used  . Alcohol use 0.0 oz/week     Comment: daily     Allergies   Shellfish allergy and Bee venom   Review of Systems Review of Systems  All other systems reviewed and are negative.    Physical Exam Updated Vital Signs BP 115/68 (BP Location: Left Arm)   Pulse 89   Temp 98.1 F (36.7 C) (Oral)   Resp 18   Ht 5\' 10"  (1.778 m)   Wt 79.4 kg (175 lb)   SpO2 98%   BMI 25.11 kg/m   Physical Exam  Constitutional: He appears well-developed and well-nourished. No distress.  HENT:  Head: Normocephalic and atraumatic.  Neck: Neck supple.  Pulmonary/Chest: Effort normal.  Abdominal: Soft. He exhibits no distension. There is no tenderness. There is no rebound and no guarding.  Musculoskeletal:  Spine nontender, no crepitus, or stepoffs.  Tenderness over bilateral SI joints.  No skin changes.     Neurological: He is alert.  Skin: He is not diaphoretic.  Nursing note and vitals reviewed.    ED Treatments / Results  Labs (all labs ordered are listed, but only abnormal results are displayed) Labs Reviewed - No data to display  EKG  EKG Interpretation None  Radiology No results found.  Procedures Procedures (including critical care time)  Medications Ordered in ED Medications  ketorolac (TORADOL) injection 60 mg (60 mg Intramuscular Given 08/12/16 1151)     Initial Impression / Assessment and Plan / ED Course  I have reviewed the triage vital signs and the nursing notes.  Pertinent labs & imaging results that were available during my care of the patient were reviewed by me and considered in my medical decision making (see chart for details).     Afebrile, nontoxic patient with mechanical low back pain and likely SI joint inflammation.  No red flags for back pain.  Pt also with hx gastritis and increased NSAID usage recently, has flared up slightly.  Discussed home regimen and treatment.  Abdominal exam is benign.    D/C home with lidoderm patch, robaxin, prilosec, PCP follow up.  Discussed result, findings, treatment, and follow up  with patient.  Pt given return precautions.  Pt verbalizes understanding and agrees with plan.       Final Clinical Impressions(s) / ED Diagnoses   Final diagnoses:  Chronic bilateral low back pain, with sciatica presence unspecified  Chronic gastritis without bleeding, unspecified gastritis type    New Prescriptions Discharge Medication List as of 08/12/2016 11:54 AM    START taking these medications   Details  lidocaine (LIDODERM) 5 % Place 1 patch onto the skin daily. Remove & Discard patch within 12 hours or as directed by MD, Starting Wed 08/12/2016, Print    methocarbamol (ROBAXIN) 500 MG tablet Take 1-2 tablets (500-1,000 mg total) by mouth every 6 (six) hours as needed for muscle spasms (and pain)., Starting Wed 08/12/2016, Print    omeprazole (PRILOSEC) 20 MG capsule Take 1 capsule (20 mg total) by mouth daily., Starting Wed 08/12/2016, Print         [place], Quintana, PA-C 08/12/16 1228    Geoffery Lyons, MD 08/12/16 1414

## 2016-08-12 NOTE — Discharge Instructions (Signed)
Read the information below.  Use the prescribed medication as directed.  Please discuss all new medications with your pharmacist.  You may return to the Emergency Department at any time for worsening condition or any new symptoms that concern you.    If you develop fevers, loss of control of bowel or bladder, weakness or numbness in your legs, or are unable to walk, return to the ER for a recheck.  °

## 2017-05-28 DIAGNOSIS — F1020 Alcohol dependence, uncomplicated: Secondary | ICD-10-CM | POA: Insufficient documentation

## 2017-05-28 DIAGNOSIS — F1420 Cocaine dependence, uncomplicated: Secondary | ICD-10-CM | POA: Insufficient documentation

## 2017-05-28 DIAGNOSIS — F1210 Cannabis abuse, uncomplicated: Secondary | ICD-10-CM | POA: Insufficient documentation

## 2017-06-10 ENCOUNTER — Encounter (HOSPITAL_BASED_OUTPATIENT_CLINIC_OR_DEPARTMENT_OTHER): Payer: Self-pay | Admitting: Emergency Medicine

## 2017-06-10 ENCOUNTER — Other Ambulatory Visit: Payer: Self-pay

## 2017-06-10 ENCOUNTER — Emergency Department (HOSPITAL_BASED_OUTPATIENT_CLINIC_OR_DEPARTMENT_OTHER)
Admission: EM | Admit: 2017-06-10 | Discharge: 2017-06-10 | Disposition: A | Discharge status: Home / Self Care | Payer: Self-pay | Attending: Emergency Medicine | Admitting: Emergency Medicine

## 2017-06-10 DIAGNOSIS — F17210 Nicotine dependence, cigarettes, uncomplicated: Secondary | ICD-10-CM | POA: Insufficient documentation

## 2017-06-10 DIAGNOSIS — Z760 Encounter for issue of repeat prescription: Secondary | ICD-10-CM | POA: Insufficient documentation

## 2017-06-10 DIAGNOSIS — J45909 Unspecified asthma, uncomplicated: Secondary | ICD-10-CM | POA: Insufficient documentation

## 2017-06-10 MED ORDER — FERROUS SULFATE 325 (65 FE) MG PO TABS: 325.0000 mg | ORAL_TABLET | Freq: Three times a day (TID) | ORAL | 3 refills | Status: AC

## 2017-06-10 MED FILL — FERROUS SULFATE 325 MG TAB: 325 (65 FE) | 33 days supply | Qty: 100 | Fill #0

## 2017-06-10 NOTE — ED Notes (Signed)
NAD at this time. Pt is stable and going home.  

## 2017-06-10 NOTE — ED Provider Notes (Signed)
[facility] EMERGENCY DEPARTMENT Provider Note   CSN: [id number] Arrival date & time: 06/10/17  1004     History   Chief Complaint No chief complaint on file.   HPI Matthew Oneal is a 42 y.o. male.  Patient is a 42 year old male with a history of asthma, anemia related to ongoing bleeding from internal hemorrhoids presenting today with a request for iron prescription.  Patient recently has decided to go to [name] and they require that he has a prescription for the iron.  He states this is been ongoing for years and he has been taking over-the-counter iron.  He denies any chest pain, shortness of breath, weakness.  He is only here because of their request.  The history is provided by the patient.    Past Medical History:  Diagnosis Date  . Anemia   . Asthma   . Hemorrhoids     Patient Active Problem List   Diagnosis Date Noted  . Iron deficiency anemia due to chronic blood loss 05/27/2015    Past Surgical History:  Procedure Laterality Date  . APPENDECTOMY    . CLEFT PALATE REPAIR    . FINGER SURGERY          Home Medications    Prior to Admission medications   Medication Sig Start Date End Date Taking? Authorizing Provider  ferrous sulfate 325 (65 FE) MG tablet Take 1 tablet (325 mg total) by mouth 3 (three) times daily with meals. 06/10/17   Gwyneth SproutPlunkett, Quilla Freeze, MD    Family History No family history on file.  Social History Social History   Tobacco Use  . Smoking status: Current Every Day Smoker    Types: Cigarettes  . Smokeless tobacco: Never Used  Substance Use Topics  . Alcohol use: Yes    Alcohol/week: 0.0 oz    Comment: daily  . Drug use: No     Allergies   Shellfish allergy and Bee venom   Review of Systems Review of Systems  All other systems reviewed and are negative.    Physical Exam Updated Vital Signs BP 130/75 (BP Location: Right Arm)   Pulse 88   Temp 98.7 F (37.1 C) (Oral)   Resp 17   Ht 5\' 10"   (1.778 m)   Wt 72.6 kg (160 lb)   SpO2 98%   BMI 22.96 kg/m   Physical Exam  Constitutional: He is oriented to person, place, and time. He appears well-developed and well-nourished. No distress.  HENT:  Head: Normocephalic and atraumatic.  Eyes: Pupils are equal, round, and reactive to light. EOM are normal.  Cardiovascular: Normal rate, regular rhythm and normal heart sounds.  No murmur heard. Pulmonary/Chest: Effort normal and breath sounds normal. No respiratory distress. He has no wheezes. He has no rales.  Neurological: He is alert and oriented to person, place, and time.  Skin: Skin is warm and dry.  Psychiatric: He has a normal mood and affect. His behavior is normal.  Nursing note and vitals reviewed.    ED Treatments / Results  Labs (all labs ordered are listed, but only abnormal results are displayed) Labs Reviewed - No data to display  EKG None  Radiology No results found.  Procedures Procedures (including critical care time)  Medications Ordered in ED Medications - No data to display   Initial Impression / Assessment and Plan / ED Course  I have reviewed the triage vital signs and the nursing notes.  Pertinent labs & imaging results that  were available during my care of the patient were reviewed by me and considered in my medical decision making (see chart for details).     Patient here without complaint just requesting a prescription for iron.  He plans on going to rehab and they require that he has a prescription.  He has no complaints at this time concerning for worsening anemia.  He still has ongoing bleeding hemorrhoids but heart rate today is within normal limits, blood pressure within normal limits.  Final Clinical Impressions(s) / ED Diagnoses   Final diagnoses:  Encounter for medication refill    ED Discharge Orders        Ordered    ferrous sulfate 325 (65 FE) MG tablet  3 times daily with meals     06/10/17 1250       Gwyneth Sprout, MD 06/10/17 1252

## 2017-06-10 NOTE — ED Triage Notes (Signed)
Pt presents with wanting a prescription for iron meds. Pt is attempting to get into [facility] and needs a prescription for the iron pills he normally takes OTC. [facility] will not allow him to take OTC meds.

## 2018-01-06 ENCOUNTER — Encounter (HOSPITAL_BASED_OUTPATIENT_CLINIC_OR_DEPARTMENT_OTHER): Payer: Self-pay | Admitting: Emergency Medicine

## 2018-01-06 ENCOUNTER — Emergency Department (HOSPITAL_BASED_OUTPATIENT_CLINIC_OR_DEPARTMENT_OTHER): Payer: Self-pay

## 2018-01-06 ENCOUNTER — Emergency Department (HOSPITAL_BASED_OUTPATIENT_CLINIC_OR_DEPARTMENT_OTHER)
Admission: EM | Admit: 2018-01-06 | Discharge: 2018-01-06 | Disposition: A | Discharge status: Home / Self Care | Payer: Self-pay | Attending: Emergency Medicine | Admitting: Emergency Medicine

## 2018-01-06 ENCOUNTER — Other Ambulatory Visit: Payer: Self-pay

## 2018-01-06 DIAGNOSIS — Y929 Unspecified place or not applicable: Secondary | ICD-10-CM | POA: Insufficient documentation

## 2018-01-06 DIAGNOSIS — J45909 Unspecified asthma, uncomplicated: Secondary | ICD-10-CM | POA: Insufficient documentation

## 2018-01-06 DIAGNOSIS — Y939 Activity, unspecified: Secondary | ICD-10-CM | POA: Insufficient documentation

## 2018-01-06 DIAGNOSIS — Y999 Unspecified external cause status: Secondary | ICD-10-CM | POA: Insufficient documentation

## 2018-01-06 DIAGNOSIS — Z79899 Other long term (current) drug therapy: Secondary | ICD-10-CM | POA: Insufficient documentation

## 2018-01-06 DIAGNOSIS — S2232XA Fracture of one rib, left side, initial encounter for closed fracture: Secondary | ICD-10-CM | POA: Insufficient documentation

## 2018-01-06 DIAGNOSIS — F17210 Nicotine dependence, cigarettes, uncomplicated: Secondary | ICD-10-CM | POA: Insufficient documentation

## 2018-01-06 DIAGNOSIS — W2209XA Striking against other stationary object, initial encounter: Secondary | ICD-10-CM | POA: Insufficient documentation

## 2018-01-06 MED ORDER — NAPROXEN 500 MG PO TABS: 500.0000 mg | ORAL_TABLET | Freq: Two times a day (BID) | ORAL | 1 refills | Status: DC

## 2018-01-06 NOTE — ED Provider Notes (Signed)
[facility] EMERGENCY DEPARTMENT Provider Note   CSN: [id number] Arrival date & time: 01/06/18  0734     History   Chief Complaint Chief Complaint  Patient presents with  . Chest Pain    HPI Matthew Oneal is a 42 y.o. male.  Patient with a 1 to 2-week history of some left lateral anterior chest wall pain.  Patient states that he hit a railing.  Said the pain there since.  Denies any fevers.  No abdominal pain no nausea or vomiting.     Past Medical History:  Diagnosis Date  . Anemia   . Asthma   . Hemorrhoids     Patient Active Problem List   Diagnosis Date Noted  . Iron deficiency anemia due to chronic blood loss 05/27/2015    Past Surgical History:  Procedure Laterality Date  . APPENDECTOMY    . CLEFT PALATE REPAIR    . FINGER SURGERY          Home Medications    Prior to Admission medications   Medication Sig Start Date End Date Taking? Authorizing Provider  ferrous sulfate 325 (65 FE) MG tablet Take 1 tablet (325 mg total) by mouth 3 (three) times daily with meals. 06/10/17   Gwyneth Sprout, MD  naproxen (NAPROSYN) 500 MG tablet Take 1 tablet (500 mg total) by mouth 2 (two) times daily. 01/06/18   Vanetta Mulders, MD    Family History No family history on file.  Social History Social History   Tobacco Use  . Smoking status: Current Every Day Smoker    Packs/day: 1.00    Types: Cigarettes  . Smokeless tobacco: Never Used  Substance Use Topics  . Alcohol use: Yes    Alcohol/week: 0.0 standard drinks    Comment: daily  . Drug use: No     Allergies   Shellfish allergy and Bee venom   Review of Systems Review of Systems  Constitutional: Negative for fever.  HENT: Negative for congestion.   Eyes: Negative for redness.  Respiratory: Negative for shortness of breath.   Cardiovascular: Positive for chest pain.  Gastrointestinal: Negative for abdominal pain, nausea and vomiting.  Genitourinary: Negative for flank pain.    Musculoskeletal: Negative for back pain.  Neurological: Negative for headaches.  Hematological: Does not bruise/bleed easily.  Psychiatric/Behavioral: Negative for confusion.     Physical Exam Updated Vital Signs BP 131/89 (BP Location: Left Arm)   Pulse 74   Temp 98.1 F (36.7 C) (Oral)   Resp 18   Ht 1.778 m (5\' 10" )   Wt 77.1 kg   SpO2 99%   BMI 24.39 kg/m   Physical Exam  Constitutional: He is oriented to person, place, and time. He appears well-developed and well-nourished. No distress.  HENT:  Head: Normocephalic and atraumatic.  Mouth/Throat: Oropharynx is clear and moist.  Eyes: Pupils are equal, round, and reactive to light. Conjunctivae and EOM are normal.  Neck: Neck supple.  Cardiovascular: Normal rate and regular rhythm.  Pulmonary/Chest: Effort normal and breath sounds normal. No respiratory distress. He exhibits tenderness.  Tenderness in the left anterior and lateral lower rib area.  More so laterally.  No crepitance.  Abdominal: Soft. Bowel sounds are normal. There is no tenderness.  No abdominal tenderness at all.  No tenderness over the left upper quadrant.  Musculoskeletal: Normal range of motion.  Neurological: He is alert and oriented to person, place, and time. No cranial nerve deficit or sensory deficit. He exhibits normal muscle tone.  Coordination normal.  Skin: Skin is warm.  Nursing note and vitals reviewed.    ED Treatments / Results  Labs (all labs ordered are listed, but only abnormal results are displayed) Labs Reviewed - No data to display  EKG None  Radiology Dg Ribs Unilateral W/chest Left  Result Date: 01/06/2018 CLINICAL DATA:  Left lower anterolateral rib pain. EXAM: LEFT RIBS AND CHEST - 3+ VIEW COMPARISON:  Chest radiograph 09/04/2014 FINDINGS: There is a minimally displaced fracture of the left lateral ninth rib. No evidence of pneumothorax. Normal cardiomediastinal silhouette. Clear lungs. Normal osseous mineralization.  IMPRESSION: Minimally displaced fracture of the lateral left ninth rib. Electronically Signed   By: Ted Mcalpine M.D.   On: 01/06/2018 08:53    Procedures Procedures (including critical care time)  Medications Ordered in ED Medications - No data to display   Initial Impression / Assessment and Plan / ED Course  I have reviewed the triage vital signs and the nursing notes.  Pertinent labs & imaging results that were available during my care of the patient were reviewed by me and considered in my medical decision making (see chart for details).     X-rays show evidence of a left ninth rib fracture.  This would explain your pain.  Take the Naprosyn as directed.  Review of patient's previous renal function has been normal.  No significant underlying pulmonary abnormalities.  Final Clinical Impressions(s) / ED Diagnoses   Final diagnoses:  Closed fracture of one rib of left side, initial encounter    ED Discharge Orders         Ordered    naproxen (NAPROSYN) 500 MG tablet  2 times daily     01/06/18 0908           Vanetta Mulders, MD 01/06/18 [phone]

## 2018-01-06 NOTE — Discharge Instructions (Addendum)
Take the Naprosyn as directed.  X-rays show a left sided ninth rib fracture.  Expect to have some pain and discomfort for 2 to 3 months.  Things will settle down after about 2 weeks.  No work restrictions.  Return for development of fever or worse trouble breathing.  Recommend taking the full-strength Naprosyn at least for the next couple weeks.

## 2018-01-06 NOTE — ED Triage Notes (Signed)
Pt sts he fell against a stair rail 2 weeks ago, injuring his left anterior lower ribs.  Continues to have pain with movement and cough and is tender to touch.

## 2019-03-27 DIAGNOSIS — F332 Major depressive disorder, recurrent severe without psychotic features: Secondary | ICD-10-CM | POA: Insufficient documentation

## 2019-03-27 DIAGNOSIS — F1210 Cannabis abuse, uncomplicated: Secondary | ICD-10-CM | POA: Insufficient documentation

## 2019-05-13 IMAGING — CR DG RIBS W/ CHEST 3+V*L*
4 series · 4 of 4 positions shown · non-contrast
Comparison: Chest radiograph 09/04/2014

CLINICAL DATA: Left lower anterolateral rib pain.

EXAM:
LEFT RIBS AND CHEST - 3+ VIEW

[w chest pa]
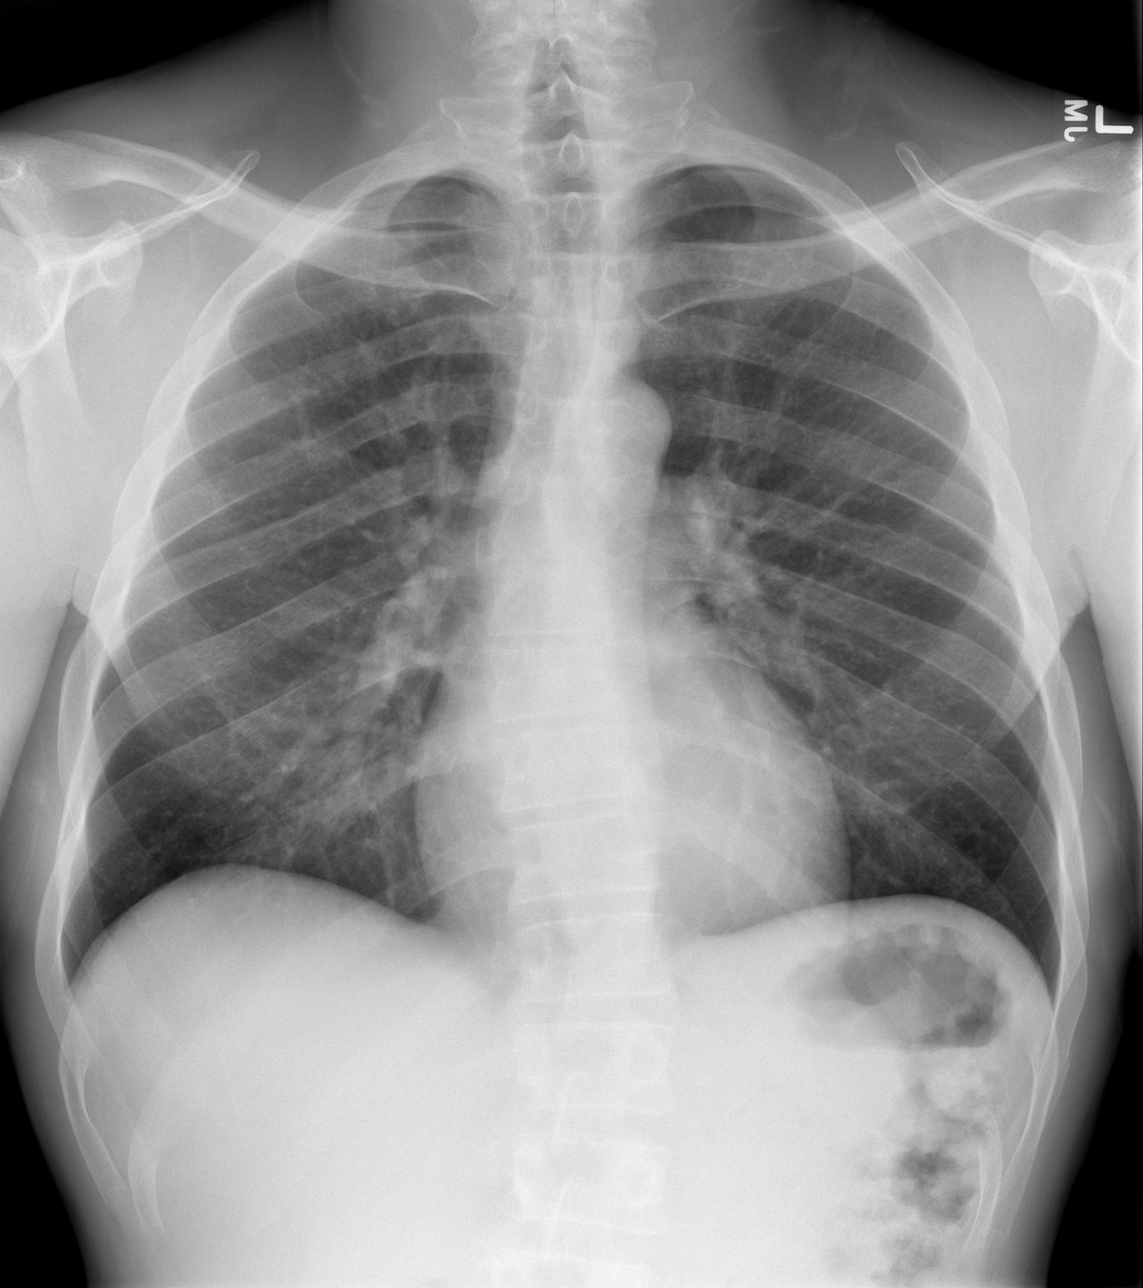

[w ribs ap/pa upper left]
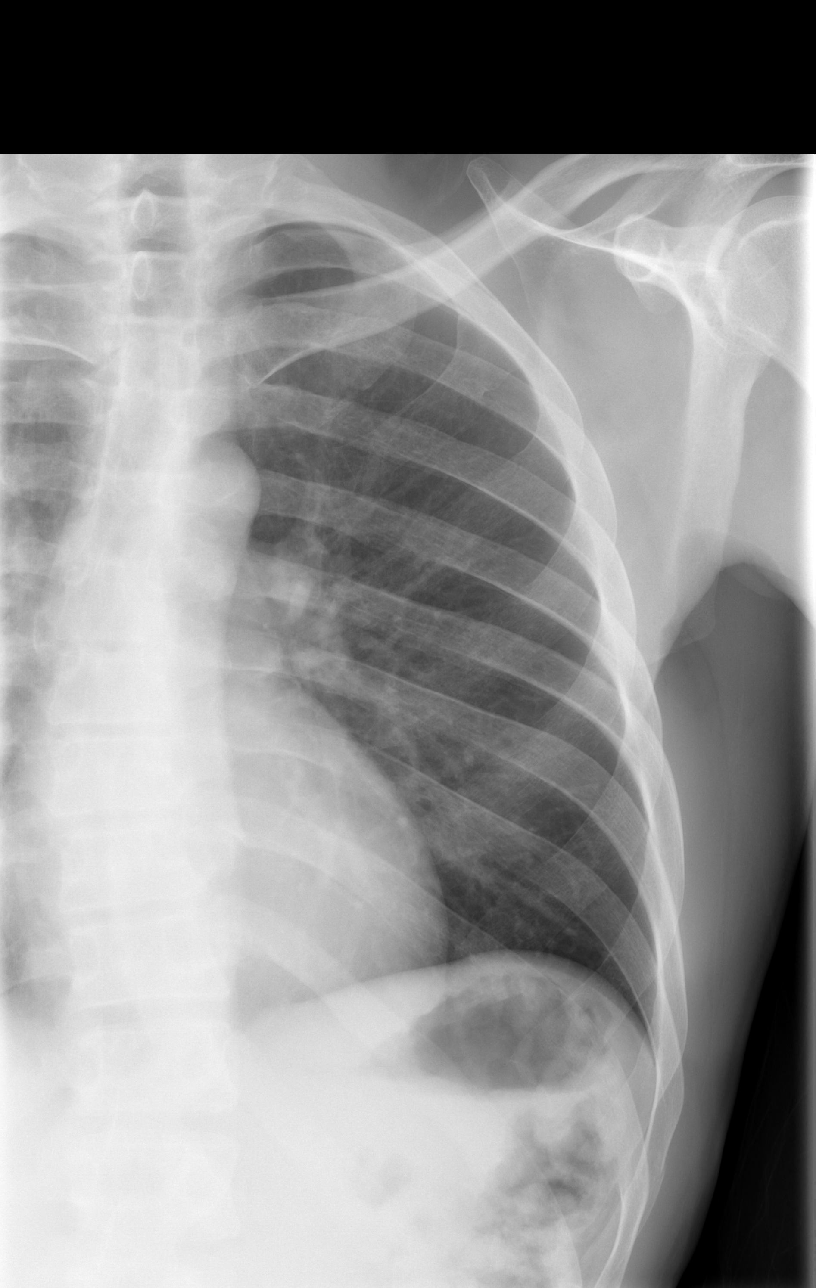

[w ribs ap/pa lower left]
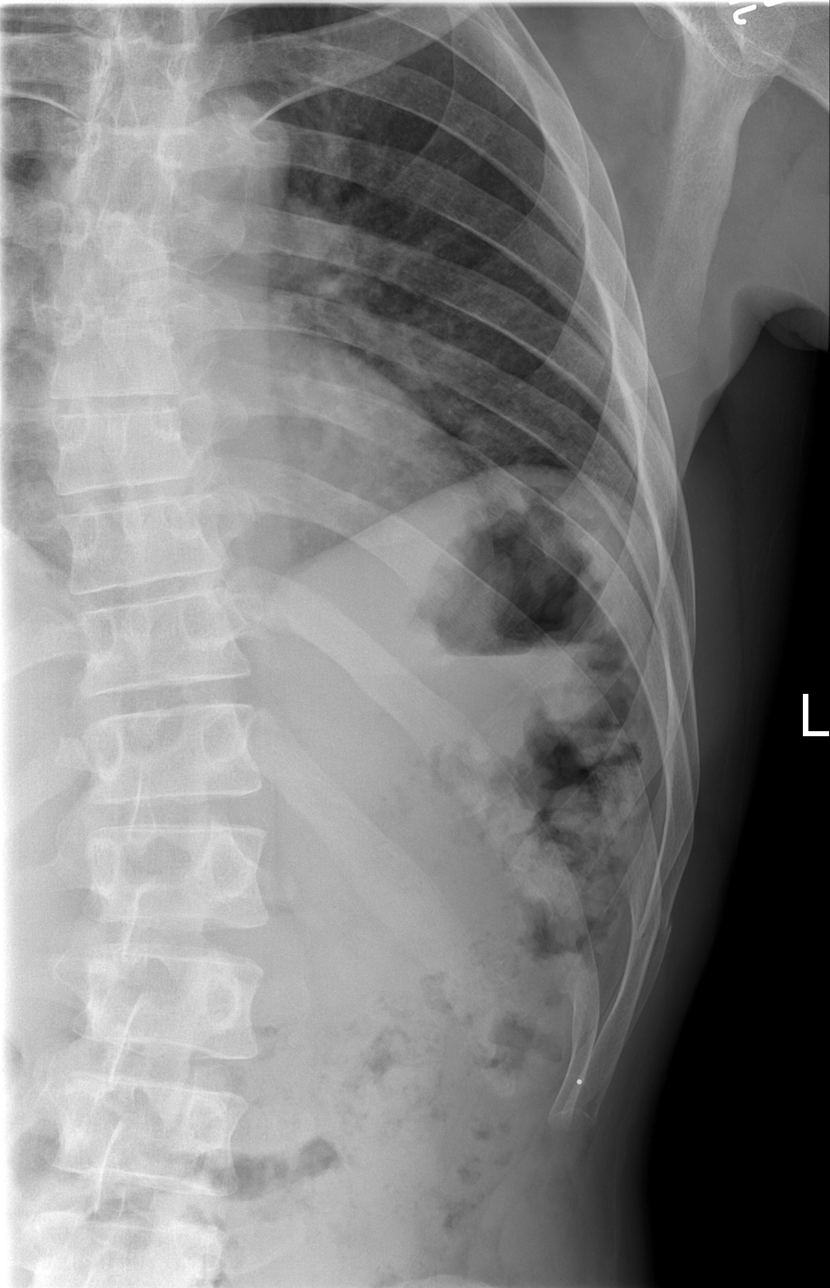

[w ribs oblique left]
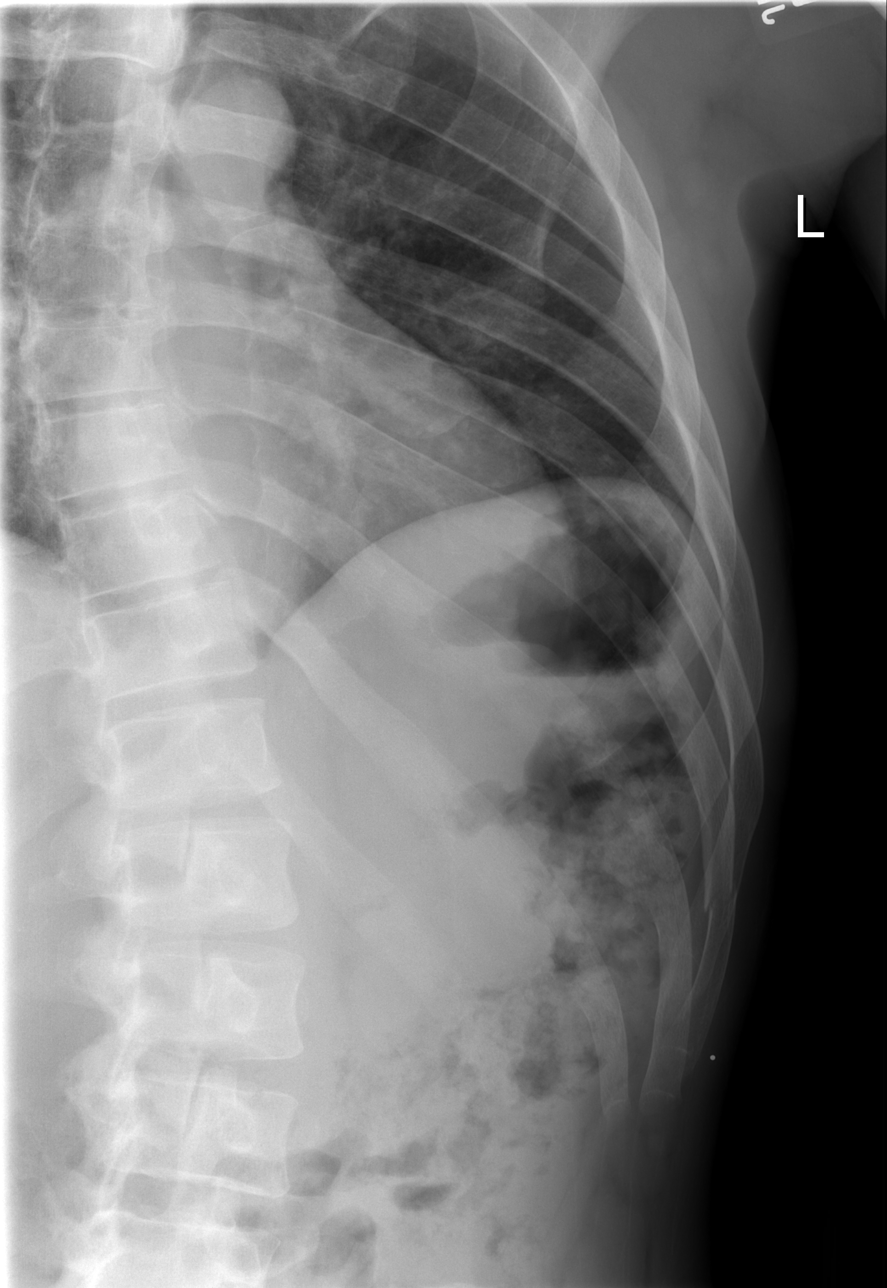

[4 of 4 positions shown; findings below may reference images not displayed]

FINDINGS: There is a minimally displaced fracture of the left lateral ninth
rib.

No evidence of pneumothorax.

Normal cardiomediastinal silhouette. Clear lungs. Normal osseous
mineralization.
IMPRESSION: Minimally displaced fracture of the lateral left ninth rib.

## 2023-03-24 ENCOUNTER — Encounter: Payer: Self-pay | Admitting: Family

## 2023-03-31 ENCOUNTER — Encounter: Payer: MEDICAID | Admitting: Family

## 2023-03-31 NOTE — Progress Notes (Signed)
 Erroneous encounter-disregard

## 2023-06-14 ENCOUNTER — Emergency Department (HOSPITAL_BASED_OUTPATIENT_CLINIC_OR_DEPARTMENT_OTHER)
Admission: EM | Admit: 2023-06-14 | Discharge: 2023-06-14 | Discharge status: Against Medical Advice | Payer: MEDICAID | Attending: Emergency Medicine | Admitting: Emergency Medicine

## 2023-06-14 ENCOUNTER — Other Ambulatory Visit: Payer: Self-pay

## 2023-06-14 ENCOUNTER — Emergency Department (HOSPITAL_BASED_OUTPATIENT_CLINIC_OR_DEPARTMENT_OTHER): Payer: MEDICAID

## 2023-06-14 ENCOUNTER — Encounter (HOSPITAL_BASED_OUTPATIENT_CLINIC_OR_DEPARTMENT_OTHER): Payer: Self-pay | Admitting: Emergency Medicine

## 2023-06-14 DIAGNOSIS — K625 Hemorrhage of anus and rectum: Secondary | ICD-10-CM | POA: Diagnosis not present

## 2023-06-14 DIAGNOSIS — R21 Rash and other nonspecific skin eruption: Secondary | ICD-10-CM | POA: Insufficient documentation

## 2023-06-14 DIAGNOSIS — Z5321 Procedure and treatment not carried out due to patient leaving prior to being seen by health care provider: Secondary | ICD-10-CM | POA: Insufficient documentation

## 2023-06-14 DIAGNOSIS — R0789 Other chest pain: Secondary | ICD-10-CM | POA: Diagnosis present

## 2023-06-14 LAB — BASIC METABOLIC PANEL WITH GFR
Anion gap: 11 (ref 5–15)
BUN: 11 mg/dL (ref 6–20)
CO2: 23 mmol/L (ref 22–32)
Calcium: 9 mg/dL (ref 8.9–10.3)
Chloride: 104 mmol/L (ref 98–111)
Creatinine, Ser: 1.15 mg/dL (ref 0.61–1.24)
GFR, Estimated: >60 mL/min (ref >60)
Glucose, Bld: 97 mg/dL (ref 70–99)
Potassium: 3.9 mmol/L (ref 3.5–5.1)
Sodium: 138 mmol/L (ref 135–145)

## 2023-06-14 LAB — CBC
HCT: 40.7 % (ref 39.0–52.0)
Hemoglobin: 13.7 g/dL (ref 13.0–17.0)
MCH: 31.5 pg (ref 26.0–34.0)
MCHC: 33.7 g/dL (ref 30.0–36.0)
MCV: 93.6 fL (ref 80.0–100.0)
Platelets: 233 10*3/uL (ref 150–400)
RBC: 4.35 MIL/uL (ref 4.22–5.81)
RDW: 14.6 % (ref 11.5–15.5)
WBC: 6.4 10*3/uL (ref 4.0–10.5)
nRBC: 0 % (ref 0.0–0.2)

## 2023-06-14 LAB — TROPONIN I (HIGH SENSITIVITY): Troponin I (High Sensitivity): 2 ng/L (ref <18)

## 2023-06-14 NOTE — ED Notes (Addendum)
Pt called for in lobby x 2 with no answer

## 2023-06-14 NOTE — ED Triage Notes (Signed)
 C/o left sided CP x 2 days. Rads into back.  Rectal bleeding x 1 year that is worse over the last 2 days. Reports large clots.

## 2023-06-14 NOTE — ED Notes (Signed)
 Patient transported to X-ray

## 2023-12-07 ENCOUNTER — Ambulatory Visit
Admission: EM | Admit: 2023-12-07 | Discharge: 2023-12-07 | Disposition: A | Discharge status: Home / Self Care | Payer: MEDICAID | Attending: Family Medicine | Admitting: Family Medicine

## 2023-12-07 ENCOUNTER — Telehealth: Payer: Self-pay | Admitting: Family Medicine

## 2023-12-07 DIAGNOSIS — J4521 Mild intermittent asthma with (acute) exacerbation: Secondary | ICD-10-CM | POA: Diagnosis not present

## 2023-12-07 DIAGNOSIS — J069 Acute upper respiratory infection, unspecified: Secondary | ICD-10-CM

## 2023-12-07 LAB — POC SOFIA SARS ANTIGEN FIA: SARS Coronavirus 2 Ag: NEGATIVE

## 2023-12-07 MED ORDER — PREDNISONE 20 MG PO TABS: 40.0000 mg | ORAL_TABLET | Freq: Every day | ORAL | 0 refills | Status: DC

## 2023-12-07 MED ORDER — BENZONATATE 100 MG PO CAPS: 100.0000 mg | ORAL_CAPSULE | Freq: Three times a day (TID) | ORAL | 0 refills | Status: DC | PRN

## 2023-12-07 MED ORDER — PREDNISONE 20 MG PO TABS: 40.0000 mg | ORAL_TABLET | Freq: Every day | ORAL | 0 refills | Status: AC

## 2023-12-07 MED ORDER — BENZONATATE 100 MG PO CAPS: 100.0000 mg | ORAL_CAPSULE | Freq: Three times a day (TID) | ORAL | 0 refills | Status: AC | PRN

## 2023-12-07 MED ORDER — ALBUTEROL SULFATE HFA 108 (90 BASE) MCG/ACT IN AERS: 2.0000 | INHALATION_SPRAY | RESPIRATORY_TRACT | 0 refills | Status: DC | PRN

## 2023-12-07 MED ORDER — ALBUTEROL SULFATE HFA 108 (90 BASE) MCG/ACT IN AERS: 2.0000 | INHALATION_SPRAY | RESPIRATORY_TRACT | 0 refills | Status: AC | PRN

## 2023-12-07 NOTE — ED Provider Notes (Signed)
 [name]    CSN: [id number] Arrival date & time: 12/07/23  1420      History   Chief Complaint Chief Complaint  Patient presents with   Chest Pain   Shortness of Breath    HPI Matthew Oneal is a 48 y.o. male.    Chest Pain Associated symptoms: shortness of breath   Shortness of Breath Associated symptoms: chest pain   Here for chest pain and shortness of breath that began bothering him yesterday.  He has also had some nasal congestion and a mild cough.  No fever and no vomiting or diarrhea.  He does have a history of asthma.  NKDA but he is allergic to shellfish.   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 Authorizing Provider  albuterol  (VENTOLIN  HFA) 108 (90 Base) MCG/ACT inhaler Inhale 2 puffs into the lungs every 4 (four) hours as needed for wheezing or shortness of breath. 12/07/23  Yes Vonna Sharlet POUR, MD  benzonatate  (TESSALON ) 100 MG capsule Take 1 capsule (100 mg total) by mouth 3 (three) times daily as needed for cough. 12/07/23  Yes Braxton Vantrease K, MD  butalbital-acetaminophen -caffeine (FIORICET) 50-325-40 MG tablet Take 1 tablet by mouth every 4 (four) hours as needed. 11/06/15  Yes [provider]  ferrous sulfate  325 (65 FE) MG tablet Take 1 tablet (325 mg total) by mouth 3 (three) times  daily with meals. 06/10/17  Yes Plunkett, Benton, MD  omeprazole  (PRILOSEC) 10 MG capsule Take 10 mg by mouth daily.   Yes [provider]  predniSONE  (DELTASONE ) 20 MG tablet Take 2 tablets (40 mg total) by mouth daily with breakfast for 5 days. 12/07/23 12/12/23 Yes Zykira Matlack K, MD  venlafaxine XR (EFFEXOR-XR) 75 MG 24 hr capsule Take 75 mg by mouth daily with breakfast. 03/31/19  Yes [provider]    Family History History reviewed. No pertinent family history.  Social History Social History   Tobacco Use   Smoking status: Every Day    Current packs/day: 1.00    Types: Cigarettes   Smokeless tobacco: Never  Vaping Use   Vaping status: Never Used  Substance Use Topics   Alcohol use: Yes    Alcohol/week: 0.0 standard drinks of alcohol    Comment: daily   Drug use: No     Allergies   Bee venom and Shellfish allergy   Review of Systems Review of Systems  Respiratory:  Positive for shortness of breath.   Cardiovascular:  Positive for chest pain.     Physical Exam Triage Vital Signs ED Triage Vitals  Encounter Vitals Group     BP 12/07/23 1432 121/83     Girls Systolic BP Percentile --      Girls Diastolic BP Percentile --  Boys Systolic BP Percentile --      Boys Diastolic BP Percentile --      Pulse Rate 12/07/23 1432 87     Resp 12/07/23 1432 18     Temp 12/07/23 1432 97.8 F (36.6 C)     Temp Source 12/07/23 1432 Oral     SpO2 12/07/23 1432 97 %     Weight 12/07/23 1430 187 lb (84.8 kg)     Height 12/07/23 1430 5' 10 (1.778 m)     Head Circumference --      Peak Flow --      Pain Score 12/07/23 1428 5     Pain Loc --      Pain Education --      Exclude from Growth Chart --    No data found.  Updated Vital Signs BP 121/83 (BP Location: Right Arm)   Pulse 87   Temp 97.8 F (36.6 C) (Oral)   Resp 18   Ht 5' 10 (1.778 m)   Wt 84.8 kg   SpO2 97%   BMI 26.83 kg/m   Visual Acuity Right Eye Distance:   Left Eye  Distance:   Bilateral Distance:    Right Eye Near:   Left Eye Near:    Bilateral Near:     Physical Exam Vitals reviewed.  Constitutional:      General: He is not in acute distress.    Appearance: He is not toxic-appearing.  HENT:     Nose: Nose normal.     Mouth/Throat:     Mouth: Mucous membranes are moist.     Pharynx: No oropharyngeal exudate or posterior oropharyngeal erythema.  Eyes:     Extraocular Movements: Extraocular movements intact.     Conjunctiva/sclera: Conjunctivae normal.     Pupils: Pupils are equal, round, and reactive to light.  Cardiovascular:     Rate and Rhythm: Normal rate and regular rhythm.     Heart sounds: No murmur heard. Pulmonary:     Effort: Pulmonary effort is normal. No respiratory distress.     Breath sounds: Normal breath sounds. No stridor. No wheezing, rhonchi or rales.  Musculoskeletal:     Cervical back: Neck supple.  Lymphadenopathy:     Cervical: No cervical adenopathy.  Skin:    Capillary Refill: Capillary refill takes less than 2 seconds.     Coloration: Skin is not jaundiced or pale.  Neurological:     General: No focal deficit present.     Mental Status: He is alert and oriented to person, place, and time.  Psychiatric:        Behavior: Behavior normal.      UC Treatments / Results  Labs (all labs ordered are listed, but only abnormal results are displayed) Labs Reviewed  POC SOFIA SARS ANTIGEN FIA - Normal    EKG   Radiology No results found.  Procedures Procedures (including critical care time)  Medications Ordered in UC Medications - No data to display  Initial Impression / Assessment and Plan / UC Course  I have reviewed the triage vital signs and the nursing notes.  Pertinent labs & imaging results that were available during my care of the patient were reviewed by me and considered in my medical decision making (see chart for details).     COVID test is negative. EKG is negative and shows normal  sinus rhythm with no concerning changes.  Albuterol  and 5-day burst of prednisone  are sent to the pharmacy to treat the  asthma exacerbation. Work note is provided.  Final Clinical Impressions(s) / UC Diagnoses   Final diagnoses:  Acute upper respiratory infection  Mild intermittent asthma with acute exacerbation     Discharge Instructions      Your EKG was normal   COVID test was negative  Albuterol  inhaler--do 2 puffs every 4 hours as needed for shortness of breath or wheezing  Take prednisone  20 mg--2 daily for 5 days  Take benzonatate  100 mg, 1 tab every 8 hours as needed for cough.      ED Prescriptions     Medication Sig Dispense Auth. Provider   albuterol  (VENTOLIN  HFA) 108 (90 Base) MCG/ACT inhaler Inhale 2 puffs into the lungs every 4 (four) hours as needed for wheezing or shortness of breath. 1 each Vonna Sharlet POUR, MD   predniSONE  (DELTASONE ) 20 MG tablet Take 2 tablets (40 mg total) by mouth daily with breakfast for 5 days. 10 tablet Vonna Sharlet POUR, MD   benzonatate  (TESSALON ) 100 MG capsule Take 1 capsule (100 mg total) by mouth 3 (three) times daily as needed for cough. 21 capsule Sinclaire Artiga K, MD      PDMP not reviewed this encounter.   Vonna Sharlet POUR, MD 12/07/23 [phone]

## 2023-12-07 NOTE — ED Triage Notes (Signed)
 Patient reports chest pain/sob and weakness (starting 2 days ago while at work). No injury. No other acute illness.

## 2023-12-07 NOTE — Telephone Encounter (Signed)
 I just realized prescriptions were sent to the wrong pharmacy and of this telephone encounter to send them to a different [facility].

## 2023-12-07 NOTE — Discharge Instructions (Signed)
 Your EKG was normal   COVID test was negative  Albuterol  inhaler--do 2 puffs every 4 hours as needed for shortness of breath or wheezing  Take prednisone  20 mg--2 daily for 5 days  Take benzonatate  100 mg, 1 tab every 8 hours as needed for cough.

## 2024-02-14 ENCOUNTER — Ambulatory Visit
Admission: EM | Admit: 2024-02-14 | Discharge: 2024-02-14 | Disposition: A | Discharge status: Home / Self Care | Payer: MEDICAID

## 2024-02-14 DIAGNOSIS — R197 Diarrhea, unspecified: Secondary | ICD-10-CM | POA: Diagnosis not present

## 2024-02-14 DIAGNOSIS — R112 Nausea with vomiting, unspecified: Secondary | ICD-10-CM | POA: Diagnosis not present

## 2024-02-14 DIAGNOSIS — K625 Hemorrhage of anus and rectum: Secondary | ICD-10-CM

## 2024-02-14 MED ORDER — ONDANSETRON 4 MG PO TBDP: 4.0000 mg | ORAL_TABLET | Freq: Three times a day (TID) | ORAL | 0 refills | Status: AC | PRN

## 2024-02-14 MED ORDER — ONDANSETRON 4 MG PO TBDP
4.0000 mg | ORAL_TABLET | Freq: Once | ORAL | Status: AC
  Administered 2024-02-14: 4 mg via ORAL

## 2024-02-14 NOTE — Discharge Instructions (Signed)
 We are checking some basic labs and we will contact you for urgent follow-up as needed.  In the meantime if you develop increased rectal bleeding, syncope, or new concerning symptoms seek immediate care at the nearest emergency department.  Use the nausea medicine every 8 hours.  Try sips of water, ginger ale and broth today.  Starting tomorrow you can progress to a solid bland diet.  This consists of bananas, rice, toast, applesauce and boiled chicken.  Avoid fried or spicy foods.   It is importantly follow-up with your primary care provider as scheduled.  Seek immediate care for new concerning symptoms.

## 2024-02-14 NOTE — ED Triage Notes (Addendum)
 Patient here reporting stomach pain starting yesterday with nausea/vomiting, diarrhea/loose/watery stools & body aches which continues today. Last night did wake up wet (sweating) & 1 vomiting episode around 1-2 am. No fever known.   Of note; history of anemia with blood seen in stools. Followed by PCP, upcoming appt: 12-4, per patient.

## 2024-02-14 NOTE — ED Provider Notes (Signed)
 [name]    CSN: [id number] Arrival date & time: 02/14/24  1444      History   Chief Complaint Chief Complaint  Patient presents with   Nausea   Dizziness   Generalized Body Aches    HPI Matthew Oneal is a 48 y.o. male.   Patient presents to clinic over concern of abdominal pain, nausea, vomiting, diarrhea, body aches, chills and feeling unwell since yesterday.  Has had bright red blood in his stool with some clots.  Does have a history of anemia and has had a previous iron transfusion.  Has not had syncope or loss of consciousness.  Was told he had internal hemorrhoids in the past.  Denies blood in emesis.  Has not tried medications or interventions at home.  Wife is sick with similar symptoms.  They both had some burgers yesterday.  Has had 2-3 episodes of vomiting today and around 5 episodes of diarrhea.  Tried some bacon this morning but vomited this back up.  The history is provided by the patient and medical records.  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 Authorizing Provider  ferrous sulfate  325 (65 FE) MG tablet Take 325 mg by mouth. 03/27/19  Yes [provider]  omeprazole  (PRILOSEC) 10 MG capsule Take 10 mg by mouth daily.   Yes [provider]  ondansetron  (ZOFRAN -ODT) 4 MG  disintegrating tablet Take 1 tablet (4 mg total) by mouth every 8 (eight) hours as needed for nausea or vomiting. 02/14/24  Yes Aldous Housel  N, FNP  albuterol  (VENTOLIN  HFA) 108 (90 Base) MCG/ACT inhaler Inhale 2 puffs into the lungs every 4 (four) hours as needed for wheezing or shortness of breath. 12/07/23   Vonna Sharlet POUR, MD  benzonatate  (TESSALON ) 100 MG capsule Take 1 capsule (100 mg total) by mouth 3 (three) times daily as needed for cough. 12/07/23   Banister, Pamela K, MD  butalbital-acetaminophen -caffeine (FIORICET) 50-325-40 MG tablet Take 1 tablet by mouth every 4 (four) hours as needed. 11/06/15   [provider]  ferrous sulfate  325 (65 FE) MG tablet Take 1 tablet (325 mg total) by mouth 3 (three) times daily with meals. 06/10/17   Doretha Folks, MD  venlafaxine XR (EFFEXOR-XR) 75 MG 24 hr capsule Take 75 mg by mouth daily with breakfast. 03/31/19   [provider]    Family History History reviewed. No pertinent family history.  Social History Social History   Tobacco Use   Smoking status: Every Day    Current packs/day: 1.00    Types: Cigarettes   Smokeless tobacco: Never  Vaping Use   Vaping status: Never Used  Substance Use Topics   Alcohol use: Yes    Alcohol/week: 0.0 standard drinks of alcohol  Comment: daily   Drug use: No     Allergies   Bee venom and Shellfish allergy   Review of Systems Review of Systems  Per HPI  Physical Exam Triage Vital Signs ED Triage Vitals  Encounter Vitals Group     BP 02/14/24 1527 102/71     Girls Systolic BP Percentile --      Girls Diastolic BP Percentile --      Boys Systolic BP Percentile --      Boys Diastolic BP Percentile --      Pulse Rate 02/14/24 1527 (!) 107     Resp 02/14/24 1527 18     Temp 02/14/24 1527 98.1 F (36.7 C)     Temp Source 02/14/24 1527 Oral     SpO2 02/14/24 1527 96 %     Weight 02/14/24 1526 184 lb (83.5 kg)     Height 02/14/24 1526 5' 10 (1.778 m)      Head Circumference --      Peak Flow --      Pain Score 02/14/24 1522 2     Pain Loc --      Pain Education --      Exclude from Growth Chart --    No data found.  Updated Vital Signs BP 102/71 (BP Location: Right Arm)   Pulse (!) 107   Temp 98.1 F (36.7 C) (Oral)   Resp 18   Ht 5' 10 (1.778 m)   Wt 184 lb (83.5 kg)   SpO2 96%   BMI 26.40 kg/m   Visual Acuity Right Eye Distance:   Left Eye Distance:   Bilateral Distance:    Right Eye Near:   Left Eye Near:    Bilateral Near:     Physical Exam Vitals and nursing note reviewed.  Constitutional:      Appearance: Normal appearance.  HENT:     Head: Normocephalic and atraumatic.     Right Ear: External ear normal.     Left Ear: External ear normal.     Nose: Nose normal.     Mouth/Throat:     Mouth: Mucous membranes are moist.  Eyes:     Conjunctiva/sclera: Conjunctivae normal.  Cardiovascular:     Rate and Rhythm: Normal rate.  Pulmonary:     Effort: Pulmonary effort is normal. No respiratory distress.  Abdominal:     General: Abdomen is flat. Bowel sounds are normal.     Palpations: Abdomen is soft.     Tenderness: There is no abdominal tenderness. There is no guarding or rebound.  Skin:    General: Skin is warm and dry.  Neurological:     General: No focal deficit present.     Mental Status: He is alert.  Psychiatric:        Mood and Affect: Mood normal.      UC Treatments / Results  Labs (all labs ordered are listed, but only abnormal results are displayed) Labs Reviewed  CBC WITH DIFFERENTIAL/PLATELET  COMPREHENSIVE METABOLIC PANEL WITH GFR    EKG   Radiology No results found.  Procedures Procedures (including critical care time)  Medications Ordered in UC Medications  ondansetron  (ZOFRAN -ODT) disintegrating tablet 4 mg (4 mg Oral Given 02/14/24 1530)    Initial Impression / Assessment and Plan / UC Course  I have reviewed the triage vital signs and the nursing notes.  Pertinent  labs & imaging results that were available during my care of the patient were reviewed by me  and considered in my medical decision making (see chart for details).  Vitals and triage reviewed, patient is hemodynamically stable.  Abdomen is soft and nontender with active bowel sounds, without rebound or guarding, low concern for acute abdomen.  Vital signs stable in clinic.  Abdominal pain and nausea much improved after ODT Zofran .  With history of anemia and rectal bleeding will check CBC and CMP.  Strict emergency precautions given if rectal bleeding evolves or worsens, patient verbalized understanding.  Suspect viral gastroenteritis.  Plan of care, follow-up care return precautions given, no questions at this time.  Work note provided.    Final Clinical Impressions(s) / UC Diagnoses   Final diagnoses:  Nausea, vomiting and diarrhea  Rectal bleeding     Discharge Instructions      We are checking some basic labs and we will contact you for urgent follow-up as needed.  In the meantime if you develop increased rectal bleeding, syncope, or new concerning symptoms seek immediate care at the nearest emergency department.  Use the nausea medicine every 8 hours.  Try sips of water, ginger ale and broth today.  Starting tomorrow you can progress to a solid bland diet.  This consists of bananas, rice, toast, applesauce and boiled chicken.  Avoid fried or spicy foods.   It is importantly follow-up with your primary care provider as scheduled.  Seek immediate care for new concerning symptoms.     ED Prescriptions     Medication Sig Dispense Auth. Provider   ondansetron  (ZOFRAN -ODT) 4 MG disintegrating tablet Take 1 tablet (4 mg total) by mouth every 8 (eight) hours as needed for nausea or vomiting. 20 tablet Dreama, Quaneisha Hanisch  N, FNP      PDMP not reviewed this encounter.   Dreama, Nalany Steedley  N, FNP 02/14/24 1558

## 2024-02-15 ENCOUNTER — Ambulatory Visit (HOSPITAL_COMMUNITY): Payer: Self-pay | Admitting: Emergency Medicine

## 2024-02-15 LAB — COMPREHENSIVE METABOLIC PANEL WITH GFR
ALT: 10 IU/L (ref 0–44)
AST: 21 IU/L (ref 0–40)
Albumin: 4.6 g/dL (ref 4.1–5.1)
Alkaline Phosphatase: 68 IU/L (ref 47–123)
BUN/Creatinine Ratio: 10 (ref 9–20)
BUN: 13 mg/dL (ref 6–24)
Bilirubin Total: 0.5 mg/dL (ref 0.0–1.2)
CO2: 22 mmol/L (ref 20–29)
Calcium: 9 mg/dL (ref 8.7–10.2)
Chloride: 100 mmol/L (ref 96–106)
Creatinine, Ser: 1.29 mg/dL — ABNORMAL HIGH (ref 0.76–1.27)
Globulin, Total: 2.7 g/dL (ref 1.5–4.5)
Glucose: 113 mg/dL — ABNORMAL HIGH (ref 70–99)
Potassium: 4 mmol/L (ref 3.5–5.2)
Sodium: 136 mmol/L (ref 134–144)
Total Protein: 7.3 g/dL (ref 6.0–8.5)
eGFR: 68 mL/min/1.73 (ref >59)

## 2024-02-15 LAB — CBC WITH DIFFERENTIAL/PLATELET
Basophils Absolute: 0 x10E3/uL (ref 0.0–0.2)
Basos: 1 %
EOS (ABSOLUTE): 0.2 x10E3/uL (ref 0.0–0.4)
Eos: 4 %
Hematocrit: 38 % (ref 37.5–51.0)
Hemoglobin: 11.7 g/dL — ABNORMAL LOW (ref 13.0–17.7)
Immature Grans (Abs): 0 x10E3/uL (ref 0.0–0.1)
Immature Granulocytes: 0 %
Lymphocytes Absolute: 1.1 x10E3/uL (ref 0.7–3.1)
Lymphs: 25 %
MCH: 26.8 pg (ref 26.6–33.0)
MCHC: 30.8 g/dL — ABNORMAL LOW (ref 31.5–35.7)
MCV: 87 fL (ref 79–97)
Monocytes Absolute: 0.3 x10E3/uL (ref 0.1–0.9)
Monocytes: 7 %
Neutrophils Absolute: 2.7 x10E3/uL (ref 1.4–7.0)
Neutrophils: 62 %
Platelets: 255 x10E3/uL (ref 150–450)
RBC: 4.37 x10E6/uL (ref 4.14–5.80)
RDW: 15.9 % — ABNORMAL HIGH (ref 11.6–15.4)
WBC: 4.3 x10E3/uL (ref 3.4–10.8)

## 2024-02-17 ENCOUNTER — Ambulatory Visit: Payer: MEDICAID | Admitting: Nurse Practitioner

## 2024-04-12 ENCOUNTER — Encounter: Payer: Self-pay | Admitting: Family

## 2024-04-19 ENCOUNTER — Ambulatory Visit: Payer: MEDICAID | Admitting: Nurse Practitioner

## 2024-04-20 ENCOUNTER — Encounter: Payer: Self-pay | Admitting: General Practice
